# Patient Record
Sex: Female | Born: 1960
Health system: Southern US, Community
[De-identification: ages and names within clinical notes are randomized; demographics above are authoritative.]

## PROBLEM LIST (undated history)

## (undated) DIAGNOSIS — K219 Gastro-esophageal reflux disease without esophagitis: Secondary | ICD-10-CM

## (undated) DIAGNOSIS — K449 Diaphragmatic hernia without obstruction or gangrene: Secondary | ICD-10-CM

## (undated) DIAGNOSIS — F419 Anxiety disorder, unspecified: Secondary | ICD-10-CM

## (undated) HISTORY — PX: PARTIAL HYSTERECTOMY: SHX80

## (undated) HISTORY — PX: NOSE SURGERY: SHX723

## (undated) HISTORY — PX: VAGINA RECONSTRUCTION SURGERY: SHX828

## (undated) HISTORY — DX: Diaphragmatic hernia without obstruction or gangrene: K44.9

---

## 2004-10-28 ENCOUNTER — Ambulatory Visit: Payer: Self-pay | Admitting: Family Medicine

## 2004-12-22 ENCOUNTER — Other Ambulatory Visit: Admission: RE | Admit: 2004-12-22 | Discharge: 2004-12-22 | Payer: Self-pay | Admitting: Obstetrics and Gynecology

## 2005-02-09 ENCOUNTER — Ambulatory Visit: Payer: Self-pay | Admitting: Family Medicine

## 2005-04-12 ENCOUNTER — Ambulatory Visit: Payer: Self-pay | Admitting: Gastroenterology

## 2005-04-17 ENCOUNTER — Ambulatory Visit (HOSPITAL_COMMUNITY): Admission: RE | Admit: 2005-04-17 | Discharge: 2005-04-17 | Payer: Self-pay | Admitting: Gastroenterology

## 2005-04-25 ENCOUNTER — Ambulatory Visit: Payer: Self-pay | Admitting: Gastroenterology

## 2005-04-25 ENCOUNTER — Ambulatory Visit (HOSPITAL_COMMUNITY): Admission: RE | Admit: 2005-04-25 | Discharge: 2005-04-25 | Payer: Self-pay | Admitting: Gastroenterology

## 2005-04-25 DIAGNOSIS — K449 Diaphragmatic hernia without obstruction or gangrene: Secondary | ICD-10-CM | POA: Insufficient documentation

## 2005-04-25 HISTORY — PX: ESOPHAGOGASTRODUODENOSCOPY: SHX1529

## 2006-03-21 ENCOUNTER — Ambulatory Visit: Payer: Self-pay | Admitting: Family Medicine

## 2006-06-06 ENCOUNTER — Encounter (INDEPENDENT_AMBULATORY_CARE_PROVIDER_SITE_OTHER): Payer: Self-pay | Admitting: *Deleted

## 2006-06-06 ENCOUNTER — Ambulatory Visit (HOSPITAL_COMMUNITY): Admission: RE | Admit: 2006-06-06 | Discharge: 2006-06-07 | Payer: Self-pay | Admitting: Obstetrics and Gynecology

## 2007-04-22 DIAGNOSIS — F411 Generalized anxiety disorder: Secondary | ICD-10-CM | POA: Insufficient documentation

## 2007-04-22 DIAGNOSIS — F339 Major depressive disorder, recurrent, unspecified: Secondary | ICD-10-CM | POA: Insufficient documentation

## 2008-04-03 DIAGNOSIS — F341 Dysthymic disorder: Secondary | ICD-10-CM | POA: Insufficient documentation

## 2008-04-03 DIAGNOSIS — J4530 Mild persistent asthma, uncomplicated: Secondary | ICD-10-CM | POA: Insufficient documentation

## 2008-04-03 DIAGNOSIS — J45909 Unspecified asthma, uncomplicated: Secondary | ICD-10-CM | POA: Insufficient documentation

## 2008-04-06 ENCOUNTER — Ambulatory Visit: Payer: Self-pay | Admitting: Internal Medicine

## 2008-04-06 DIAGNOSIS — R1319 Other dysphagia: Secondary | ICD-10-CM | POA: Insufficient documentation

## 2008-04-06 DIAGNOSIS — K219 Gastro-esophageal reflux disease without esophagitis: Secondary | ICD-10-CM | POA: Insufficient documentation

## 2008-04-09 ENCOUNTER — Encounter: Payer: Self-pay | Admitting: Internal Medicine

## 2008-04-09 ENCOUNTER — Ambulatory Visit: Payer: Self-pay | Admitting: Internal Medicine

## 2008-04-09 HISTORY — PX: ESOPHAGOGASTRODUODENOSCOPY: SHX1529

## 2008-10-20 ENCOUNTER — Ambulatory Visit: Payer: Self-pay | Admitting: Psychiatry

## 2008-10-20 ENCOUNTER — Other Ambulatory Visit (HOSPITAL_COMMUNITY): Admission: RE | Admit: 2008-10-20 | Discharge: 2008-11-06 | Payer: Self-pay | Admitting: Psychiatry

## 2010-07-29 NOTE — Op Note (Signed)
NAMESHARNA, Sandra Deleon             ACCOUNT NO.:  [id number]   MEDICAL RECORD NO.:  [id number]          PATIENT TYPE:  AMB   LOCATION:  SDC                           FACILITY:  [facility]   PHYSICIAN:  Carrington Clamp, M.D. DATE OF BIRTH:  February 21, 1961   DATE OF PROCEDURE:  06/06/2006  DATE OF DISCHARGE:                               OPERATIVE REPORT   PREOPERATIVE DIAGNOSES:  1. Menometrorrhagia.  2. Probable endometrial polyp.  3. Right and left lower quadrant pain.   POSTOPERATIVE DIAGNOSES:  1. Menometrorrhagia.  2. Probable endometrial polyp.  3. Right and left lower quadrant pain.  4. Cystotomy.   PROCEDURES:  1. Laparoscopic-assisted vaginal hysterectomy.  2. Cystoscopy.  3. Cystotomy repair.   SURGEON:  Carrington Clamp, M.D.   ASSISTANT:  Luvenia Redden, M.D.   ANESTHESIA:  General endotracheal anesthesia.   SPECIMENS:  Uterine cervix.   ESTIMATED BLOOD LOSS:  200 mL.   IV FLUIDS:  2300 mL.   URINE OUTPUT:  Not measured.   COMPLICATIONS:  A midline cystotomy not involving the ureteral orifices  or the trigone.  The cystotomy had been performed before and after the  repair and the bilateral ureteral jets were seen well away from the  defect of the bladder.  The bladder was inspected with the cystoscope  once the defect had been repaired and found to have no other defects in  it.  The ureters were seen coursing in the pelvis before and after the  hysterectomy and the cystotomy repair.   FINDINGS:  A 7 to 8 weeks' size uterus, slightly boggy.  The ovaries  were normal in appearance bilaterally and the patient wished to retain  them if they were normal in appearance, so they were left.  There was a  small hydatidiform cyst on the right-hand side that had no adhesions to  it, so it was left.  The Falope rings were removed from the abdomen.  The appendix was not able to be seen but there was a small adhesion of  the cecum to the anterior abdominal wall.  Since this  was out of the  range of where the pain was felt by the patient, it was left alone.  The  liver edge and gallbladder appeared normal.  As noted above, the  ureteral orifices were inspected before and after the cystotomy repair  and the hysterectomy.  The bladder was found to be intact after the  cystotomy repair, and the ureteral orifices spilling bilateral indigo  carmine without problem.   MEDICATIONS:  Lidocaine with epinephrine.  Enfamil milk in the bladder  and indigo carmine.   COUNTS:  The counts were correct times three.   TECHNIQUE:  After adequate general anesthesia was achieved, the patient  was prepped and draped in the usual sterile fashion in the dorsal  lithotomy position.  A Kahn cannula was placed into the cervix after  grasping it with a single-tooth tenaculum.  The bladder was emptied with  a catheter during the procedure.  Attention was turned to the abdomen  where a 2 cm infraumbilical incision was made with the scalpel.  The  Veress needle was introduced into the abdomen without complication and  without aspiration of bowel contents or blood.  The abdomen was  insufflated and the 10 mm trocar placed without complication.   Two 5 mm ports were placed under direct visualization of the camera  outside of the area of the inferior epigastrics.  An inspection of the  pelvis was performed and the above findings noted.  The right tube was  then grasped with the gyrus instrument and cautery was achieved proximal  to the patient from where the Falope rings were.  Cautery continued in  the uterine ovarian ligament and the round ligament to just above the  level of the bladder reflection.  The same procedure was performed on  the other side and good hemostasis was achieved.   Attention was then turned to the vagina where the bladder was filled  with 60 mL of Enfamil.  The catheter was removed and the cervix grasped  with the Lahey clamp.  The cervix was injected with  lidocaine with epi  in a circumferential manner.  A scalpel was used to incise the cervix in  a circumferential manner.  Posteriorly Mayo's were used to enter into  the cul-de-sac with a long duckbill placed.  Dissection began anteriorly  with the Metzenbaum scissors which looked like the appropriate plane at  the time.  The peritoneum was unable to be breached anteriorly so four  alternating successive bites of the Heaney clamp were used to secure  both the uterosacral's and the first part of the cardinal ligament.  The  uterosacral's were Heaney stitched and the first part of the cardinal  ligament was secured with a regular stitch.  Each pedicle was then  incised and the uterosacral ligaments stitches retained.   At this point in time a large quantity of milk appeared on the field and  it was evident that there had been a breach of the bladder.  Manual  palpation after replacing the Foley and filling the bulb revealed a  small 1-1/2 cm defect midline.  Cystoscopy was performed at this point  and after the sterile milk was able to be washed from the bladder, a  cystotomy could be seen in the posterior bladder but superior to the  trigone and definitely not involving the ureteral orifices, both of  which were spilling clear urine.  The cystoscope was removed and the  Foley placed with the bulb again.  Dissection was re-initiated on the  cervix deeper towards the cervix this time to ensure being in the  vesicouterine fascia and posterior to the bladder.  By manually feeling  for the Foley bulb, we were able to identify the correct plane and  finally enter into the anterior peritoneum.  The rest of the cardinal  ligament and the lower portion of broad ligament were then secured with  alternating successive bites of the Heaney clamp.  Each pedicle was  incised with the Mayo scissors and then secured with a stitch of 0  Vicryl.  Once the uterus was amputated and handed off to Pathology,  the anterior  peritoneum edge was grasped as well as the anterior vaginal edge was  grasped and the Foley bulb aided in the palpation of the cystotomy.  The  edges of the bladder were clearly carefully identified and the bladder  defect was then closed with a running stitch of 3-0 Vicryl.  This was  done in multiple stitches to ensure water tightness.  A  second layer  closed the vesicouterine fascia over the defect with 2-0 Vicryl.  The  cystoscope was passed back into the bladder and indigo carmine was  injected and could eventually be seen exiting from both ureteral  orifices which were away from the defect.  The defect appeared closed  and intact, and the bladder did fill.  There were no other defects in  the bladder noted at this point.  The anterior peritoneum was then  grasped with a Tresa Endo and secured with 2-0 Vicryl Swedgeon stitch.  This  stitch then incorporated the left uterosacral, went through the  posterior vagina between the uterosacral's and back into the peritoneal  cavity, came across in a modified Moschcowitz stitch and came out the  vagina and back in again between the uterosacral's and the posterior cul-  de-sac area.  The right uterosacral was then secured as well as the  anterior peritoneum and the whole pursestring stitch was cinched down to  close the peritoneum.  The vaginal cuff was then closed with anterior to  posterior figure-of-eight stitches of 0 Vicryl.  Hemostasis was achieved  here.  Gloves were changed and attention was turned to the abdomen  again.   The abdomen was reinsufflated and the pedicles inspected and found to be  hemostatic.  There appeared to be a large amount of probably 100 mL of  water in the anterior peritoneum that was probably filled in during the  time that we were doing the cystoscopy.  The bladder had been intact.  There was every indication that the bladder was intact to the cystoscopy  that had been performed a few minutes earlier  that we believed that this  was left over fluid from the cystoscopy.  The ureters were reinspected  and found to be coursing out of the field of dissection and coursing  well.  All instruments were then withdrawn from the abdomen.  The 10 mm  trocar fascial site was closed with a figure-of-eight stitch of 2-0  Vicryl.  The 5 mm trocar sites or skin sites were closed with a through-  and-through stitch of 3-0 Vicryl Rapide.  The skin incisions were closed  with Dermabond.  The patient's bladder was emptied with a new Foley and  all instruments were withdrawn from the vagina.  The complication will  be discussed with the patient tomorrow when she is awake.  I will be  discussing the complication with her husband when I go to talk to him  just shortly.      Carrington Clamp, M.D.  Electronically Signed     MH/MEDQ  D:  06/06/2006  T:  06/06/2006  Job:  [id number]

## 2011-05-03 ENCOUNTER — Emergency Department (HOSPITAL_COMMUNITY)
Admission: EM | Admit: 2011-05-03 | Discharge: 2011-05-03 | Disposition: A | Discharge status: Home / Self Care | Payer: BC Managed Care – PPO | Attending: Emergency Medicine | Admitting: Emergency Medicine

## 2011-05-03 ENCOUNTER — Encounter (HOSPITAL_COMMUNITY): Payer: Self-pay | Admitting: *Deleted

## 2011-05-03 ENCOUNTER — Emergency Department (HOSPITAL_COMMUNITY): Payer: BC Managed Care – PPO

## 2011-05-03 DIAGNOSIS — J40 Bronchitis, not specified as acute or chronic: Secondary | ICD-10-CM

## 2011-05-03 DIAGNOSIS — J45909 Unspecified asthma, uncomplicated: Secondary | ICD-10-CM | POA: Insufficient documentation

## 2011-05-03 DIAGNOSIS — F411 Generalized anxiety disorder: Secondary | ICD-10-CM | POA: Insufficient documentation

## 2011-05-03 HISTORY — DX: Anxiety disorder, unspecified: F41.9

## 2011-05-03 HISTORY — DX: Gastro-esophageal reflux disease without esophagitis: K21.9

## 2011-05-03 MED ORDER — ALBUTEROL SULFATE HFA 108 (90 BASE) MCG/ACT IN AERS: 1.0000 | INHALATION_SPRAY | Freq: Four times a day (QID) | RESPIRATORY_TRACT | Status: DC | PRN

## 2011-05-03 MED ORDER — PREDNISONE 20 MG PO TABS: 60.0000 mg | ORAL_TABLET | Freq: Every day | ORAL | Status: AC

## 2011-05-03 MED ORDER — ALBUTEROL SULFATE (5 MG/ML) 0.5% IN NEBU
5.0000 mg | INHALATION_SOLUTION | Freq: Once | RESPIRATORY_TRACT | Status: AC
  Administered 2011-05-03: 5 mg via RESPIRATORY_TRACT
  Filled 2011-05-03: qty 1

## 2011-05-03 MED ORDER — AZITHROMYCIN 250 MG PO TABS: ORAL_TABLET | ORAL | Status: AC

## 2011-05-03 MED ORDER — SALINE SPRAY 0.65 % NA SOLN
NASAL | Status: AC
  Filled 2011-05-03: qty 44

## 2011-05-03 MED ORDER — PREDNISONE 20 MG PO TABS
60.0000 mg | ORAL_TABLET | Freq: Once | ORAL | Status: AC
  Administered 2011-05-03: 60 mg via ORAL
  Filled 2011-05-03: qty 3

## 2011-05-03 MED ORDER — SALINE SPRAY 0.65 % NA SOLN
1.0000 | NASAL | Status: DC | PRN
  Administered 2011-05-03: 1 via NASAL
  Filled 2011-05-03: qty 44

## 2011-05-03 NOTE — ED Notes (Addendum)
Reports cough x 4 days; reports feeling like throat is swelling x 1 day, worse tonight; c/o difficulty swallowing and feeling short of breath, increasingly worse this morning. Rec'd Albuterol neb tx x 1 en route and states this helped relieve her symptoms.

## 2011-05-03 NOTE — ED Notes (Signed)
Called [facility] for medications  

## 2011-05-03 NOTE — ED Provider Notes (Signed)
History     CSN: [id number]  Arrival date & time 05/03/11  0312   First MD Initiated Contact with Patient 05/03/11 [phone]      Chief Complaint  Patient presents with  . Dysphagia  . Shortness of Breath    (Consider location/radiation/quality/duration/timing/severity/associated sxs/prior treatment) Patient is a 51 y.o. female presenting with shortness of breath. The history is provided by the patient.  Shortness of Breath  Episode onset: about 4 weeks ago. Not getting better. The onset was gradual. The problem occurs frequently. The problem has been unchanged. The problem is moderate. The symptoms are relieved by beta-agonist inhalers and rest. The symptoms are aggravated by nothing. Associated symptoms include cough and wheezing. Pertinent negatives include no chest pain, no chest pressure, no fever and no rhinorrhea. Her past medical history is significant for asthma. There were no sick contacts.   Nonsmoker with history of asthma. States she gets bronchitis every year and this feels like her bronchitis again. No fevers. She does have associated congestion with continuous dry cough. Tonight is keeping her awake and she presents here for evaluation. Her throat feels irritated when she coughs but denies any pain or radiation. No chest discomfort. No leg pain or swelling. No abdominal pain. No nausea vomiting or diarrhea. Her friend gave her antibiotics about 2 weeks ago that did not help now she feels like her symptoms are getting worse. moderate in severity.albuterol seemed to help in route. She states she does not take her albuterol like she should. She has never required admission or intubation for asthma.  Past Medical History  Diagnosis Date  . Asthma   . Anxiety   . GERD (gastroesophageal reflux disease)   . Hernia     Past Surgical History  Procedure Date  . Nose surgery     blockage removed from right side of nose  . Esophageal dilation     No family history on  file.  History  Substance Use Topics  . Smoking status: Not on file  . Smokeless tobacco: Not on file  . Alcohol Use:     OB History    Grav Para Term Preterm Abortions TAB SAB Ect Mult Living                  Review of Systems  Constitutional: Negative for fever and chills.  HENT: Positive for congestion. Negative for rhinorrhea, trouble swallowing, neck pain, neck stiffness and voice change.   Eyes: Negative for pain.  Respiratory: Positive for cough and wheezing.   Cardiovascular: Negative for chest pain and leg swelling.  Gastrointestinal: Negative for abdominal pain.  Genitourinary: Negative for dysuria.  Musculoskeletal: Negative for back pain.  Skin: Negative for rash.  Neurological: Negative for headaches.  All other systems reviewed and are negative.    Allergies  Codeine and Pseudoephedrine  Home Medications   Current Outpatient Rx  Name Route Sig Dispense Refill  . CLONAZEPAM 1 MG PO TABS Oral Take 1 mg by mouth at bedtime as needed.    Marland Kitchen LAMOTRIGINE 25 MG PO TABS Oral Take 50 mg by mouth daily.    Marland Kitchen MONTELUKAST SODIUM 10 MG PO TABS Oral Take 10 mg by mouth at bedtime.    Marland Kitchen ONE-DAILY MULTI VITAMINS PO TABS Oral Take 1 tablet by mouth daily.      BP 101/67  Pulse 60  Temp(Src) 98.6 F (37 C) (Oral)  Resp 20  Ht 5\' 4"  (1.626 m)  Wt 130 lb (58.968 kg)  BMI 22.31 kg/m2  SpO2 100%  Physical Exam  Constitutional: She is oriented to person, place, and time. She appears well-developed and well-nourished.  HENT:  Head: Normocephalic and atraumatic.  Eyes: Conjunctivae and EOM are normal. Pupils are equal, round, and reactive to light.  Neck: Trachea normal. Neck supple. No thyromegaly present.  Cardiovascular: Normal rate, regular rhythm, S1 normal, S2 normal and normal pulses.     No systolic murmur is present   No diastolic murmur is present  Pulses:      Radial pulses are 2+ on the right side, and 2+ on the left side.  Pulmonary/Chest: Effort  normal. No respiratory distress. She has no wheezes. She has no rhonchi. She has no rales. She exhibits no tenderness.       Mildly decreased breath sounds bilaterally. Dry cough during exam  Abdominal: Soft. Normal appearance and bowel sounds are normal. There is no tenderness. There is no CVA tenderness and negative Murphy's sign.  Musculoskeletal:       BLE:s Calves nontender, no cords or erythema, negative Homans sign  Neurological: She is alert and oriented to person, place, and time. She has normal strength. No cranial nerve deficit or sensory deficit. GCS eye subscore is 4. GCS verbal subscore is 5. GCS motor subscore is 6.  Skin: Skin is warm and dry. No rash noted. She is not diaphoretic.  Psychiatric: Her speech is normal.       Cooperative and appropriate    ED Course  Procedures (including critical care time)  Improved with albuterol breathing treatment and prednisone. Chest x-ray reviewed no pneumothorax or infiltrates.  MDM   Clinical bronchitis, Complicated by history of asthma. Given albuterol and steroids. Z-Pak for worsening symptoms and although likely started as a viral infection may now be complicated by bacterial infection as well.        Sunnie Nielsen, MD 05/03/11 [phone]

## 2011-05-03 NOTE — Discharge Instructions (Signed)
Bronchitis     Bronchitis is a problem of the air tubes leading to your lungs. This problem makes it hard for air to get in and out of the lungs. You may cough a lot because your air tubes are narrow. Going without care can cause lasting (chronic) bronchitis.  HOME CARE   · Drink enough fluids to keep your pee (urine) clear or pale yellow.   · Use a cool mist humidifier.   · Quit smoking if you smoke. If you keep smoking, the bronchitis might not get better.   · Only take medicine as told by your doctor.   GET HELP RIGHT AWAY IF:   · Coughing keeps you awake.   · You start to wheeze.   · You become more sick or weak.   · You have a hard time breathing or get short of breath.   · You cough up blood.   · Coughing lasts more than 2 weeks.   · You have a fever.   · Your baby is older than 3 months with a rectal temperature of 102° F (38.9° C) or higher.   · Your baby is 3 months old or younger with a rectal temperature of 100.4° F (38° C) or higher.   MAKE SURE YOU:  · Understand these instructions.   · Will watch your condition.   · Will get help right away if you are not doing well or get worse.   Document Released: 08/16/2007 Document Revised: 11/09/2010 Document Reviewed: 01/29/2009  [employer]® Patient Information ©2012 [employer].

## 2011-05-09 ENCOUNTER — Encounter: Payer: Self-pay | Admitting: Gastroenterology

## 2011-05-16 ENCOUNTER — Other Ambulatory Visit: Payer: Self-pay | Admitting: Obstetrics and Gynecology

## 2011-05-26 ENCOUNTER — Ambulatory Visit: Payer: Self-pay | Admitting: Gastroenterology

## 2011-06-19 ENCOUNTER — Ambulatory Visit: Payer: Self-pay | Admitting: Internal Medicine

## 2011-10-04 DIAGNOSIS — H81399 Other peripheral vertigo, unspecified ear: Secondary | ICD-10-CM | POA: Insufficient documentation

## 2013-01-20 ENCOUNTER — Telehealth: Payer: Self-pay | Admitting: Internal Medicine

## 2013-01-20 NOTE — Telephone Encounter (Signed)
Per pt she would like to switch care from Dr. Leone Payor to Dr. Marina Goodell. She says all her family members including her husband see Dr. Marina Goodell.

## 2013-01-21 ENCOUNTER — Encounter: Payer: Self-pay | Admitting: Internal Medicine

## 2013-01-21 NOTE — Telephone Encounter (Signed)
Left a message for the pt to c/b and schedule.  °

## 2013-01-21 NOTE — Telephone Encounter (Signed)
Sure

## 2013-01-21 NOTE — Telephone Encounter (Signed)
Dr. Perry will you accept? 

## 2013-01-21 NOTE — Telephone Encounter (Signed)
Pt is scheduled for her colon in Jan 2015 with Dr. Marina Goodell.

## 2013-02-04 ENCOUNTER — Encounter: Payer: Self-pay | Admitting: Internal Medicine

## 2013-02-20 ENCOUNTER — Encounter: Payer: Self-pay | Admitting: Internal Medicine

## 2013-02-25 ENCOUNTER — Ambulatory Visit (AMBULATORY_SURGERY_CENTER): Payer: 59

## 2013-02-25 VITALS — Ht 64.0 in

## 2013-02-25 DIAGNOSIS — Z1211 Encounter for screening for malignant neoplasm of colon: Secondary | ICD-10-CM

## 2013-02-25 NOTE — Progress Notes (Signed)
Pt came into the office today for her pre-visit prior to her colonosocpy with Dr Marina Goodell on 03/12/13. Pt is requesting an endoscopy at the same time as the colonoscopy due to having abdominal pain, bloating and burning. Pt was scheduled for an office visit with Dr Marina Goodell on 04/01/13 to discuss her symptoms. The colonoscopy  appt on 03/12/13 was cancelled. Pt understood. She will call our office if she has any other questions.

## 2013-03-12 ENCOUNTER — Encounter: Payer: BC Managed Care – PPO | Admitting: Internal Medicine

## 2013-03-27 ENCOUNTER — Encounter: Payer: BC Managed Care – PPO | Admitting: Internal Medicine

## 2013-04-01 ENCOUNTER — Encounter: Payer: Self-pay | Admitting: Internal Medicine

## 2013-04-01 ENCOUNTER — Ambulatory Visit (INDEPENDENT_AMBULATORY_CARE_PROVIDER_SITE_OTHER): Payer: 59 | Admitting: Internal Medicine

## 2013-04-01 VITALS — BP 100/70 | HR 68 | Ht 64.0 in | Wt 136.0 lb

## 2013-04-01 DIAGNOSIS — K219 Gastro-esophageal reflux disease without esophagitis: Secondary | ICD-10-CM

## 2013-04-01 DIAGNOSIS — R1310 Dysphagia, unspecified: Secondary | ICD-10-CM

## 2013-04-01 DIAGNOSIS — Z1211 Encounter for screening for malignant neoplasm of colon: Secondary | ICD-10-CM

## 2013-04-01 MED ORDER — MOVIPREP 100 G PO SOLR: 1.0000 | Freq: Once | ORAL | Status: DC

## 2013-04-01 MED ORDER — DEXLANSOPRAZOLE 60 MG PO CPDR: 60.0000 mg | DELAYED_RELEASE_CAPSULE | Freq: Every day | ORAL | Status: DC

## 2013-04-01 NOTE — Patient Instructions (Signed)
You have been scheduled for an endoscopy and colonoscopy with propofol. Please follow the written instructions given to you at your visit today. Please pick up your prep at the pharmacy within the next 1-3 days. If you use inhalers (even only as needed), please bring them with you on the day of your procedure. Your physician has requested that you go to www.startemmi.com and enter the access code given to you at your visit today. This web site gives a general overview about your procedure. However, you should still follow specific instructions given to you by our office regarding your preparation for the procedure.   You have been given some samples of Dexilant.  Take 1 daily 30 minutes before breakfast until you see Dr. Marina GoodellPerry again

## 2013-04-01 NOTE — Progress Notes (Signed)
HISTORY OF PRESENT ILLNESS:  Sandra Deleon is a 53 y.o. female with the below listed medical history who presents today at regarding possible GERD, dysphagia, and routine colon cancer screening. Previous patient of Dr. Victorino DikeSam Lamar and more recently Dr. Stan Headarl Gessner. Switching to me because I take care of her husband. In any event, the patient underwent evaluation for possible GERD and atypical sounding dysphagia in January of 2010. Upper endoscopy at that time was normal except for small hiatal hernia. Biopsies of the esophagus were normal. Empiric esophageal dilation performed. The patient cannot recall whether this was helpful. She has been on acid suppressing medications sporadically. She cannot specify. She is not certain if they help. Currently takes occasional Zantac. If she does have problems, seems to be some discomfort related to solids. No liquid issues. Remote esophagram negative. Other GI complaints include increased intestinal gas and constipation. No bleeding. No family history of colon cancer. No prior colonoscopy. No significant active medical problems.  REVIEW OF SYSTEMS:  All non-GI ROS negative except for anxiety, allergies  Past Medical History  Diagnosis Date  . Asthma   . Anxiety   . GERD (gastroesophageal reflux disease)   . Hiatal hernia     Past Surgical History  Procedure Laterality Date  . Nose surgery      blockage removed from right side of nose  . Esophagogastroduodenoscopy  04/09/2008    with Elease HashimotoMaloney dilation  . Esophagogastroduodenoscopy  04/25/2005  . Partial hysterectomy      still has ovaries  . Vagina reconstruction surgery      past childbirth    Social History Sandra FermoCynthia R Jezewski  reports that she has never smoked. She has never used smokeless tobacco. She reports that she does not drink alcohol or use illicit drugs.  family history is not on file.  Allergies  Allergen Reactions  . Codeine     rash  . Pseudoephedrine     Heart beating  through body.       PHYSICAL EXAMINATION: Vital signs: BP 100/70  Pulse 68  Ht 5\' 4"  (1.626 m)  Wt 136 lb (61.689 kg)  BMI 23.33 kg/m2  Constitutional: generally well-appearing, no acute distress Psychiatric: alert and oriented x3, cooperative Eyes: extraocular movements intact, anicteric, conjunctiva pink Mouth: oral pharynx moist, no lesions Neck: supple no lymphadenopathy Cardiovascular: heart regular rate and rhythm, no murmur Lungs: clear to auscultation bilaterally Abdomen: soft, nontender, nondistended, no obvious ascites, no peritoneal signs, normal bowel sounds, no organomegaly Rectal: Deferred until colonoscopy Extremities: no lower extremity edema bilaterally Skin: no lesions on visible extremities Neuro: No focal deficits.   ASSESSMENT:  #1. Possible GERD. Difficult to tell by history #2. Atypical dysphagia. Prior endoscopy as described. #3. Colon cancer screening. Baseline risk   PLAN:  #1. Dexilant 60 mg daily. Multiple samples provided (3 weeks) #2. Reflux precautions #3. Upper endoscopy. Possible dilation if significant stricture found.The nature of the procedure, as well as the risks, benefits, and alternatives were carefully and thoroughly reviewed with the patient. Ample time for discussion and questions allowed. The patient understood, was satisfied, and agreed to proceed. #4. Screening colonoscopy. Movi prep prescribed. Patient instructed on its use. The nature of the procedure, as well as the risks, benefits, and alternatives were carefully and thoroughly reviewed with the patient. Ample time for discussion and questions allowed. The patient understood, was satisfied, and agreed to proceed.

## 2013-04-02 ENCOUNTER — Encounter: Payer: Self-pay | Admitting: Internal Medicine

## 2013-04-16 ENCOUNTER — Encounter: Payer: Self-pay | Admitting: Internal Medicine

## 2013-04-16 ENCOUNTER — Ambulatory Visit (AMBULATORY_SURGERY_CENTER): Payer: 59 | Admitting: Internal Medicine

## 2013-04-16 VITALS — BP 99/59 | HR 50 | Temp 98.7°F | Resp 17 | Ht 64.0 in | Wt 136.0 lb

## 2013-04-16 DIAGNOSIS — R1319 Other dysphagia: Secondary | ICD-10-CM

## 2013-04-16 DIAGNOSIS — R1310 Dysphagia, unspecified: Secondary | ICD-10-CM

## 2013-04-16 DIAGNOSIS — K219 Gastro-esophageal reflux disease without esophagitis: Secondary | ICD-10-CM

## 2013-04-16 DIAGNOSIS — Z1211 Encounter for screening for malignant neoplasm of colon: Secondary | ICD-10-CM

## 2013-04-16 DIAGNOSIS — K222 Esophageal obstruction: Secondary | ICD-10-CM

## 2013-04-16 MED ORDER — SODIUM CHLORIDE 0.9 % IV SOLN: 500.0000 mL | INTRAVENOUS | Status: DC

## 2013-04-16 NOTE — Patient Instructions (Signed)
Clear liquids diet until 6 pm, then soft foods rest of day. Resume prior diet tomorrow. (see dilation diet handout) Repeat colonoscopy in 10 years. Resume current medications today. Call us with any questions or concerns. Thank you!  YOU HAD AN ENDOSCOPIC PROCEDURE TODAY AT THE [facility]: Refer to the procedure report that was given to you for any specific questions about what was found during the examination.  If the procedure report does not answer your questions, please call your gastroenterologist to clarify.  If you requested that your care partner not be given the details of your procedure findings, then the procedure report has been included in a sealed envelope for you to review at your convenience later.  YOU SHOULD EXPECT: Some feelings of bloating in the abdomen. Passage of more gas than usual.  Walking can help get rid of the air that was put into your GI tract during the procedure and reduce the bloating. If you had a lower endoscopy (such as a colonoscopy or flexible sigmoidoscopy) you may notice spotting of blood in your stool or on the toilet paper. If you underwent a bowel prep for your procedure, then you may not have a normal bowel movement for a few days.  DIET: Dilation diet, see handout.  Drink plenty of fluids but you should avoid alcoholic beverages for 24 hours.  ACTIVITY: Your care partner should take you home directly after the procedure.  You should plan to take it easy, moving slowly for the rest of the day.  You can resume normal activity the day after the procedure however you should NOT DRIVE or use heavy machinery for 24 hours (because of the sedation medicines used during the test).    SYMPTOMS TO REPORT IMMEDIATELY: A gastroenterologist can be reached at any hour.  During normal business hours, 8:30 AM to 5:00 PM Monday through Friday, call [phone].  After hours and on weekends, please call the GI answering service at [phone] who will take a  message and have the physician on call contact you.   Following lower endoscopy (colonoscopy or flexible sigmoidoscopy):  Excessive amounts of blood in the stool  Significant tenderness or worsening of abdominal pains  Swelling of the abdomen that is new, acute  Fever of 100F or higher  Following upper endoscopy (EGD)  Vomiting of blood or coffee ground material  New chest pain or pain under the shoulder blades  Painful or persistently difficult swallowing  New shortness of breath  Fever of 100F or higher  Black, tarry-looking stools  FOLLOW UP: If any biopsies were taken you will be contacted by phone or by letter within the next 1-3 weeks.  Call your gastroenterologist if you have not heard about the biopsies in 3 weeks.  Our staff will call the home number listed on your records the next business day following your procedure to check on you and address any questions or concerns that you may have at that time regarding the information given to you following your procedure. This is a courtesy call and so if there is no answer at the home number and we have not heard from you through the emergency physician on call, we will assume that you have returned to your regular daily activities without incident.  SIGNATURES/CONFIDENTIALITY: You and/or your care partner have signed paperwork which will be entered into your electronic medical record.  These signatures attest to the fact that that the information above on your After Visit Summary has been  reviewed and is understood.  Full responsibility of the confidentiality of this discharge information lies with you and/or your care-partner. 

## 2013-04-16 NOTE — Op Note (Signed)
[facility] [address] [place], [id number]   ENDOSCOPY PROCEDURE REPORT  PATIENT: Sandra Deleon, Sandra R.  MR#: [id number] BIRTHDATE: 1961/02/07 , 52  yrs. old GENDER: Female ENDOSCOPIST: Roxy CedarJohn N Calyn Sivils Jr, MD REFERRED BY:  Herb Graysammy Spear, M.D. PROCEDURE DATE:  04/16/2013 PROCEDURE:  EGD, diagnostic and Maloney dilation of esophagus  -7254 French ASA CLASS:     Class II INDICATIONS:  Dysphagia. MEDICATIONS: MAC sedation, administered by CRNA and propofol (Diprivan) 100mg  IV TOPICAL ANESTHETIC: Cetacaine Spray  DESCRIPTION OF PROCEDURE: After the risks benefits and alternatives of the procedure were thoroughly explained, informed consent was obtained.  The LB [id number] endoscope was introduced through the mouth and advanced to the second portion of the duodenum. Without limitations.  The instrument was slowly withdrawn as the mucosa was fully examined.    EXAM:The esophagus revealed a large caliber distal ring but was otherwise normal.  The stomach was normal.  The duodenum was normal.  Retroflexed views revealed a hiatal hernia.     The scope was then withdrawn from the patient and the procedure completed.  Therapy: 2354 French Maloney dilator was passed into the esophagus without resistance or heme. Tolerated well  COMPLICATIONS: There were no complications.  ENDOSCOPIC IMPRESSION: 1. Low esophageal ring status post dilation. 2. Otherwise normal EGD  RECOMMENDATIONS: 1. Clear liquids until  6 PM, then soft foods rest of day.  Resume prior diet tomorrow.  REPEAT EXAM:  eSigned:  Roxy CedarJohn N Linell Shawn Jr, MD 04/16/2013 4:02 PM   MW:UXLKGCC:Tammy Collins ScotlandSpear, MD and The Patient

## 2013-04-16 NOTE — Op Note (Signed)
[facility] [address] [place], [id number]   COLONOSCOPY PROCEDURE REPORT  PATIENT: Sandra Deleon, [name]  MR#: [id number] BIRTHDATE: [birth year] , 52  yrs. old GENDER: Female ENDOSCOPIST: Roxy CedarJohn N Norita Meigs Jr, MD REFERRED XB:JYNWGBY:Tammy Spear, M.D. PROCEDURE DATE:  04/16/2013 PROCEDURE:   Colonoscopy, screening First Screening Colonoscopy - Avg.  risk and is 50 yrs.  old or older Yes.  Prior Negative Screening - Now for repeat screening. N/A  History of Adenoma - Now for follow-up colonoscopy & has been > or = to 3 yrs.  N/A  Polyps Removed Today? No.  Recommend repeat exam, <10 yrs? No. ASA CLASS:   Class II INDICATIONS:average risk screening. MEDICATIONS: MAC sedation, administered by CRNA and propofol (Diprivan) 330mg  IV  DESCRIPTION OF PROCEDURE:   After the risks benefits and alternatives of the procedure were thoroughly explained, informed consent was obtained.  A digital rectal exam revealed no abnormalities of the rectum.   The LB [id number]  endoscope was introduced through the anus and advanced to the cecum, which was identified by both the appendix and ileocecal valve. No adverse events experienced.   The quality of the prep was good, using MoviPrep  The instrument was then slowly withdrawn as the colon was fully examined.      COLON FINDINGS: A normal appearing cecum, ileocecal valve, and appendiceal orifice were identified.  The ascending, hepatic flexure, transverse, splenic flexure, descending, sigmoid colon and rectum appeared unremarkable.  No polyps or cancers were seen. Retroflexed views revealed no abnormalities. The time to cecum=3 minutes 51 seconds.  Withdrawal time=10 minutes 57 seconds.  The scope was withdrawn and the procedure completed.  COMPLICATIONS: There were no complications.  ENDOSCOPIC IMPRESSION: 1. Normal colon  RECOMMENDATIONS: 1.  Continue current colorectal screening recommendations for "routine risk" patients  with a repeat colonoscopy in 10 years. 2.  Upper endoscopy today (see report)   eSigned:  Roxy CedarJohn N Rosena Bartle Jr, MD 04/16/2013 3:54 PM   cc: Herb Graysammy Spear, MD and The Patient

## 2013-04-16 NOTE — Progress Notes (Signed)
Report to pacu rn, vss, bbs=clear 

## 2013-04-16 NOTE — Progress Notes (Signed)
Called to room to assist during endoscopic procedure.  Patient ID and intended procedure confirmed with present staff. Received instructions for my participation in the procedure from the performing physician. ewm 

## 2013-04-17 ENCOUNTER — Telehealth: Payer: Self-pay | Admitting: *Deleted

## 2013-04-17 NOTE — Telephone Encounter (Signed)
  Follow up Call-  Call back number 04/16/2013  Post procedure Call Back phone  # [phone]  Permission to leave phone message Yes     Patient questions:  Do you have a fever, pain , or abdominal swelling? no Pain Score  0 *  Have you tolerated food without any problems? yes  Have you been able to return to your normal activities? yes  Do you have any questions about your discharge instructions: Diet   no Medications  no Follow up visit  no  Do you have questions or concerns about your Care? no  Actions: * If pain score is 4 or above: No action needed, pain <4.

## 2013-09-22 ENCOUNTER — Encounter: Payer: Self-pay | Admitting: Family

## 2013-09-22 ENCOUNTER — Ambulatory Visit (INDEPENDENT_AMBULATORY_CARE_PROVIDER_SITE_OTHER): Payer: 59 | Admitting: Family

## 2013-09-22 ENCOUNTER — Encounter (INDEPENDENT_AMBULATORY_CARE_PROVIDER_SITE_OTHER): Payer: Self-pay

## 2013-09-22 VITALS — BP 99/63 | HR 58 | Temp 98.2°F | Ht 64.25 in | Wt 136.6 lb

## 2013-09-22 DIAGNOSIS — J45909 Unspecified asthma, uncomplicated: Secondary | ICD-10-CM

## 2013-09-22 DIAGNOSIS — F341 Dysthymic disorder: Secondary | ICD-10-CM

## 2013-09-22 MED ORDER — PAROXETINE HCL 40 MG PO TABS: 40.0000 mg | ORAL_TABLET | ORAL | Status: DC

## 2013-09-22 MED ORDER — CLONAZEPAM 1 MG PO TABS: 1.0000 mg | ORAL_TABLET | Freq: Two times a day (BID) | ORAL | Status: DC

## 2013-09-22 MED ORDER — ALBUTEROL SULFATE HFA 108 (90 BASE) MCG/ACT IN AERS: 1.0000 | INHALATION_SPRAY | Freq: Four times a day (QID) | RESPIRATORY_TRACT | Status: DC | PRN

## 2013-09-22 MED ORDER — MONTELUKAST SODIUM 10 MG PO TABS
10.0000 mg | ORAL_TABLET | Freq: Every day | ORAL | Status: DC
Start: 2013-09-22 — End: 2014-10-02

## 2013-09-22 NOTE — Patient Instructions (Signed)

## 2013-09-22 NOTE — Progress Notes (Signed)
   Subjective:    Patient ID: Sandra Deleon, female    DOB: Nov 26, 1960, 53 y.o.   MRN: [id number]  Pt to establish care. Pt has anxiety, depression, and asthma.  Anxiety Presents for follow-up visit. Symptoms include depressed mood and nervous/anxious behavior. Patient reports no excessive worry, insomnia, irritability, palpitations, panic, restlessness or shortness of breath. Symptoms occur occasionally. The quality of sleep is good.   Her past medical history is significant for anxiety/panic attacks and depression. Past treatments include SSRIs and benzodiazephines. The treatment provided moderate relief. Compliance with prior treatments has been good.  Asthma Pt states she only uses her inhaler as needed. Less than once every couple of months. Pt takes her singular daily. Pt states she feels as if her asthma is well controlled.     Review of Systems  Constitutional: Negative.  Negative for irritability.  HENT: Negative.   Eyes: Negative.   Respiratory: Negative.  Negative for shortness of breath.   Cardiovascular: Negative.  Negative for palpitations.  Gastrointestinal: Negative.   Endocrine: Negative.   Genitourinary: Negative.   Musculoskeletal: Negative.   Neurological: Negative.  Negative for headaches.  Hematological: Negative.   Psychiatric/Behavioral: The patient is nervous/anxious. The patient does not have insomnia.   All other systems reviewed and are negative.      Objective:   Physical Exam  Vitals reviewed. Constitutional: She is oriented to person, place, and time. She appears well-developed and well-nourished. No distress.  HENT:  Head: Normocephalic and atraumatic.  Right Ear: External ear normal.  Mouth/Throat: Oropharynx is clear and moist.  Eyes: Pupils are equal, round, and reactive to light.  Neck: Normal range of motion. Neck supple. No thyromegaly present.  Cardiovascular: Normal rate, regular rhythm, normal heart sounds and intact distal pulses.    No murmur heard. Pulmonary/Chest: Effort normal and breath sounds normal. No respiratory distress. She has no wheezes.  Abdominal: Soft. Bowel sounds are normal. She exhibits no distension. There is no tenderness.  Musculoskeletal: Normal range of motion. She exhibits no edema and no tenderness.  Neurological: She is alert and oriented to person, place, and time. She has normal reflexes. No cranial nerve deficit.  Skin: Skin is warm and dry.  Psychiatric: She has a normal mood and affect. Her behavior is normal. Judgment and thought content normal.      BP 99/63  Pulse 58  Temp(Src) 98.2 F (36.8 C) (Oral)  Ht 5' 4.25" (1.632 m)  Wt 136 lb 9.6 oz (61.961 kg)  BMI 23.26 kg/m2     Assessment & Plan:  1. ASTHMA - albuterol (PROVENTIL HFA;VENTOLIN HFA) 108 (90 BASE) MCG/ACT inhaler; Inhale 1-2 puffs into the lungs every 6 (six) hours as needed for wheezing.  Dispense: 1 Inhaler; Refill: 6 - montelukast (SINGULAIR) 10 MG tablet; Take 1 tablet (10 mg total) by mouth at bedtime.  Dispense: 90 tablet; Refill: 3  2. DEPRESSION/ANXIETY - CMP14+EGFR - PARoxetine (PAXIL) 40 MG tablet; Take 1 tablet (40 mg total) by mouth every morning.  Dispense: 90 tablet; Refill: 3 - clonazePAM (KLONOPIN) 1 MG tablet; Take 1 tablet (1 mg total) by mouth 2 (two) times daily.  Dispense: 30 tablet; Refill: 1   Continue all meds Labs pending Health Maintenance reviewed-Pt to be scheduled for mammogram Diet and exercise encouraged RTO 1 year  Evelina Dun, FNP

## 2013-09-23 LAB — CMP14+EGFR
A/G RATIO: 2.5 (ref 1.1–2.5)
ALK PHOS: 62 IU/L (ref 39–117)
ALT: 14 IU/L (ref 0–32)
AST: 19 IU/L (ref 0–40)
Albumin: 4.2 g/dL (ref 3.5–5.5)
BILIRUBIN TOTAL: 0.4 mg/dL (ref 0.0–1.2)
BUN / CREAT RATIO: 23 (ref 9–23)
BUN: 19 mg/dL (ref 6–24)
CO2: 28 mmol/L (ref 18–29)
Calcium: 9 mg/dL (ref 8.7–10.2)
Chloride: 100 mmol/L (ref 97–108)
Creatinine, Ser: 0.82 mg/dL (ref 0.57–1.00)
GFR, EST AFRICAN AMERICAN: 95 mL/min/{1.73_m2} (ref >59)
GFR, EST NON AFRICAN AMERICAN: 83 mL/min/{1.73_m2} (ref >59)
GLUCOSE: 90 mg/dL (ref 65–99)
Globulin, Total: 1.7 g/dL (ref 1.5–4.5)
Potassium: 4 mmol/L (ref 3.5–5.2)
SODIUM: 139 mmol/L (ref 134–144)
TOTAL PROTEIN: 5.9 g/dL — AB (ref 6.0–8.5)

## 2013-09-24 ENCOUNTER — Encounter: Payer: Self-pay | Admitting: *Deleted

## 2013-11-04 ENCOUNTER — Telehealth: Payer: Self-pay | Admitting: *Deleted

## 2013-11-04 DIAGNOSIS — F341 Dysthymic disorder: Secondary | ICD-10-CM

## 2013-11-04 NOTE — Telephone Encounter (Signed)
Pt came in stating that RX for Clonazepam is incorrect It should be for #60 Please review and advise

## 2013-11-05 MED ORDER — CLONAZEPAM 1 MG PO TABS: 1.0000 mg | ORAL_TABLET | Freq: Two times a day (BID) | ORAL | Status: DC

## 2013-11-05 NOTE — Telephone Encounter (Signed)
Pt needs to turn in other RX

## 2013-11-05 NOTE — Telephone Encounter (Signed)
Pt aware corrected Rx is ready to be picked up and that she needs to bring the incorrect Rx back with her.

## 2014-02-02 ENCOUNTER — Other Ambulatory Visit: Payer: Self-pay | Admitting: Family

## 2014-02-02 NOTE — Telephone Encounter (Signed)
Last filled 01/02/14, last seen 09/22/13, Saw Christy. [facility] uses [facility]

## 2014-02-02 NOTE — Telephone Encounter (Signed)
Please call in klonopin with 0 refills 

## 2014-03-27 ENCOUNTER — Ambulatory Visit (INDEPENDENT_AMBULATORY_CARE_PROVIDER_SITE_OTHER): Payer: 59 | Admitting: Family Medicine

## 2014-03-27 ENCOUNTER — Ambulatory Visit (INDEPENDENT_AMBULATORY_CARE_PROVIDER_SITE_OTHER): Payer: 59

## 2014-03-27 ENCOUNTER — Encounter: Payer: Self-pay | Admitting: Family Medicine

## 2014-03-27 VITALS — BP 104/63 | HR 64 | Temp 97.3°F | Ht 64.0 in | Wt 143.4 lb

## 2014-03-27 DIAGNOSIS — J4541 Moderate persistent asthma with (acute) exacerbation: Secondary | ICD-10-CM

## 2014-03-27 MED ORDER — ALBUTEROL SULFATE (2.5 MG/3ML) 0.083% IN NEBU: 2.5000 mg | INHALATION_SOLUTION | Freq: Four times a day (QID) | RESPIRATORY_TRACT | Status: DC | PRN

## 2014-03-27 MED ORDER — ALBUTEROL SULFATE HFA 108 (90 BASE) MCG/ACT IN AERS: 1.0000 | INHALATION_SPRAY | Freq: Four times a day (QID) | RESPIRATORY_TRACT | Status: DC | PRN

## 2014-03-27 MED ORDER — ALBUTEROL SULFATE (2.5 MG/3ML) 0.083% IN NEBU
2.5000 mg | INHALATION_SOLUTION | Freq: Once | RESPIRATORY_TRACT | Status: AC
  Administered 2014-03-27: 2.5 mg via RESPIRATORY_TRACT

## 2014-03-27 MED ORDER — LEVOFLOXACIN 500 MG PO TABS: 500.0000 mg | ORAL_TABLET | Freq: Every day | ORAL | Status: DC

## 2014-03-27 MED ORDER — METHYLPREDNISOLONE ACETATE 80 MG/ML IJ SUSP
80.0000 mg | Freq: Once | INTRAMUSCULAR | Status: AC
  Administered 2014-03-27: 80 mg via INTRAMUSCULAR

## 2014-03-27 MED ORDER — METHYLPREDNISOLONE SODIUM SUCC 125 MG IJ SOLR
80.0000 mg | Freq: Once | INTRAMUSCULAR | Status: AC
  Administered 2014-03-27: 80 mg via INTRAMUSCULAR

## 2014-03-27 NOTE — Progress Notes (Signed)
   Subjective:    Patient ID: Sandra FermoCynthia R Line, female    DOB: 04/28/1960, 54 y.o.   MRN: [id number]  HPI  Pt is here today with head and chest congestion with wheezing and cough.  This started before Christmas but has not resolved and is getting worse per pt. patient has had significantly increased recently and her wheezing and cough. She states that she is also producing scant amounts of yellow sputum. She is significantly short of breath. This has been worsening over the last 24 hours significantly. She has been dealing with some congestion for 3 weeks or more. However the wheezing became far more pronounced while she was laying on the couch for a while one day earlier this week. She doesn't remember what day. She denies fever or chills and sweats. She has no known exposure to allergens. However she has a history of year-round allergy and she no longer takes her allergy desensitization shots.       Review of Systems  Constitutional: Negative for fever, chills, diaphoresis, appetite change, fatigue and unexpected weight change.  HENT: Negative for congestion, ear pain, hearing loss, postnasal drip, rhinorrhea, sneezing, sore throat and trouble swallowing.   Eyes: Negative for pain.  Respiratory: Positive for cough, chest tightness, shortness of breath and wheezing.   Cardiovascular: Negative for chest pain and palpitations.  Gastrointestinal: Negative for nausea, vomiting, abdominal pain, diarrhea and constipation.  Genitourinary: Negative for dysuria, frequency and menstrual problem.  Musculoskeletal: Negative for joint swelling and arthralgias.  Skin: Negative for rash.  Neurological: Positive for tremors. Negative for dizziness, weakness, numbness and headaches.  Psychiatric/Behavioral: Negative for dysphoric mood and agitation.       Objective:   Physical Exam  Constitutional: She is oriented to person, place, and time. She appears well-developed and well-nourished. No distress.    HENT:  Head: Normocephalic and atraumatic.  Right Ear: External ear normal.  Left Ear: External ear normal.  Nose: Nose normal.  Mouth/Throat: Oropharynx is clear and moist.  Eyes: Conjunctivae and EOM are normal. Pupils are equal, round, and reactive to light.  Neck: Normal range of motion. Neck supple. No thyromegaly present.  Cardiovascular: Normal rate, regular rhythm and normal heart sounds.   No murmur heard. Pulmonary/Chest: Effort normal. She has wheezes. She has no rales.  There was some diminished expiratory phase throughout the lung fields. No rales  Abdominal: Soft. Bowel sounds are normal. She exhibits no distension. There is no tenderness.  Lymphadenopathy:    She has no cervical adenopathy.  Neurological: She is alert and oriented to person, place, and time. She has normal reflexes.  Skin: Skin is warm and dry.  Psychiatric: She has a normal mood and affect. Her behavior is normal. Judgment and thought content normal.   BP 104/63 mmHg  Pulse 64  Temp(Src) 97.3 F (36.3 C) (Oral)  Ht 5\' 4"  (1.626 m)  Wt 143 lb 6.4 oz (65.046 kg)  BMI 24.60 kg/m2          Assessment & Plan:  Asthma with acute exacerbation, moderate persistent - Plan: DG Chest 2 View, methylPREDNISolone sodium succinate (SOLU-MEDROL) 125 mg/2 mL injection 80 mg, methylPREDNISolone acetate (DEPO-MEDROL) injection 80 mg, albuterol (PROVENTIL) (2.5 MG/3ML) 0.083% nebulizer solution 2.5 mg, levofloxacin (LEVAQUIN) 500 MG tablet, albuterol (PROVENTIL HFA;VENTOLIN HFA) 108 (90 BASE) MCG/ACT inhaler, albuterol (PROVENTIL) (2.5 MG/3ML) 0.083% nebulizer solution

## 2014-03-27 NOTE — Patient Instructions (Signed)
Use nebulizer treatments 4 times daily until symptoms clear. She may have to back off sooner if the tremors worsen. If she is away from home and symptoms are mild she can substitute the inhaler.

## 2014-04-10 ENCOUNTER — Other Ambulatory Visit: Payer: Self-pay | Admitting: Nurse Practitioner

## 2014-04-10 NOTE — Telephone Encounter (Signed)
Last filled 03/09/14, last seen 09/22/13. If approved, route to pool A, [facility]

## 2014-04-10 NOTE — Telephone Encounter (Signed)
rx called into pharmacy

## 2014-07-31 ENCOUNTER — Other Ambulatory Visit: Payer: Self-pay | Admitting: Family

## 2014-08-12 ENCOUNTER — Other Ambulatory Visit: Payer: Self-pay | Admitting: Family

## 2014-08-13 NOTE — Telephone Encounter (Signed)
Please review and advise.

## 2014-08-13 NOTE — Telephone Encounter (Signed)
Last seen 03/27/14 Dr Darlyn ReadStacks  If approved route to nurse to [facility]

## 2014-08-15 NOTE — Telephone Encounter (Signed)
RX called in by [employer] Deleon

## 2014-09-24 ENCOUNTER — Other Ambulatory Visit: Payer: Self-pay | Admitting: Family

## 2014-09-24 NOTE — Telephone Encounter (Signed)
Last seen 03/27/14 Dr Darlyn ReadStacks   Requesting a 90 day supply

## 2014-10-02 ENCOUNTER — Encounter (INDEPENDENT_AMBULATORY_CARE_PROVIDER_SITE_OTHER): Payer: Self-pay

## 2014-10-02 ENCOUNTER — Encounter: Payer: Self-pay | Admitting: Family Medicine

## 2014-10-02 ENCOUNTER — Ambulatory Visit (INDEPENDENT_AMBULATORY_CARE_PROVIDER_SITE_OTHER): Payer: BLUE CROSS/BLUE SHIELD | Admitting: Family Medicine

## 2014-10-02 ENCOUNTER — Other Ambulatory Visit: Payer: Self-pay | Admitting: Family

## 2014-10-02 VITALS — BP 107/61 | HR 64 | Temp 98.0°F | Ht 64.0 in | Wt 126.8 lb

## 2014-10-02 DIAGNOSIS — J302 Other seasonal allergic rhinitis: Secondary | ICD-10-CM | POA: Diagnosis not present

## 2014-10-02 DIAGNOSIS — F341 Dysthymic disorder: Secondary | ICD-10-CM | POA: Diagnosis not present

## 2014-10-02 MED ORDER — CLONAZEPAM 1 MG PO TABS: 1.0000 mg | ORAL_TABLET | Freq: Two times a day (BID) | ORAL | Status: DC

## 2014-10-02 MED ORDER — MONTELUKAST SODIUM 10 MG PO TABS: 10.0000 mg | ORAL_TABLET | Freq: Every day | ORAL | Status: DC

## 2014-10-02 MED ORDER — PAROXETINE HCL 40 MG PO TABS: 40.0000 mg | ORAL_TABLET | ORAL | Status: DC

## 2014-10-02 NOTE — Progress Notes (Signed)
Subjective:  Patient ID: Sandra Deleon, female    DOB: 23-Aug-1960  Age: 54 y.o. MRN: [id number]  CC: GAD   HPI Sandra Deleon presents for follow-up on her anxiety. She has had dramatic improvement with treatment she uses to Paxil daily. She is continuing to use the clonazepam periodically. However she has been happy and free of anxiety for several months. Today she just needs her medication refilled.  History Sandra Deleon has a past medical history of Asthma; Anxiety; GERD (gastroesophageal reflux disease); and Hiatal hernia.   She has past surgical history that includes Nose surgery; Esophagogastroduodenoscopy (04/09/2008); Esophagogastroduodenoscopy (04/25/2005); Partial hysterectomy; and Vagina reconstruction surgery.   Her family history includes Heart disease in her father and sister; Hypertension in her brother.She reports that she has never smoked. She has never used smokeless tobacco. She reports that she does not drink alcohol or use illicit drugs.  Outpatient Prescriptions Prior to Visit  Medication Sig Dispense Refill  . Multiple Vitamin (MULTIVITAMIN) tablet Take 1 tablet by mouth daily.    . clonazePAM (KLONOPIN) 1 MG tablet TAKE 1 TABLET TWICE A DAY 60 tablet 0  . montelukast (SINGULAIR) 10 MG tablet Take 1 tablet (10 mg total) by mouth at bedtime. 90 tablet 3  . PARoxetine (PAXIL) 40 MG tablet Take 1 tablet (40 mg total) by mouth every morning. 90 tablet 3  . albuterol (PROVENTIL HFA;VENTOLIN HFA) 108 (90 BASE) MCG/ACT inhaler Inhale 1-2 puffs into the lungs every 6 (six) hours as needed for wheezing. (Patient not taking: Reported on 10/02/2014) 1 Inhaler 6  . albuterol (PROVENTIL) (2.5 MG/3ML) 0.083% nebulizer solution Take 3 mLs (2.5 mg total) by nebulization every 6 (six) hours as needed for wheezing or shortness of breath. (Patient not taking: Reported on 10/02/2014) 150 mL 1  . levofloxacin (LEVAQUIN) 500 MG tablet Take 1 tablet (500 mg total) by mouth daily. (Patient not  taking: Reported on 10/02/2014) 7 tablet 0   No facility-administered medications prior to visit.    ROS Review of Systems  Constitutional: Negative for fever, chills, diaphoresis, appetite change and fatigue.  HENT: Negative for congestion, ear pain, hearing loss, postnasal drip, rhinorrhea, sore throat and trouble swallowing.   Respiratory: Negative for cough, chest tightness and shortness of breath.   Cardiovascular: Negative for chest pain and palpitations.  Gastrointestinal: Negative for abdominal pain.  Musculoskeletal: Negative for arthralgias.  Skin: Negative for rash.  Psychiatric/Behavioral: Negative.     Objective:  BP 107/61 mmHg  Pulse 64  Temp(Src) 98 F (36.7 C) (Oral)  Ht 5\' 4"  (1.626 m)  Wt 126 lb 12.8 oz (57.516 kg)  BMI 21.75 kg/m2  BP Readings from Last 3 Encounters:  10/02/14 107/61  03/27/14 104/63  09/22/13 99/63    Wt Readings from Last 3 Encounters:  10/02/14 126 lb 12.8 oz (57.516 kg)  03/27/14 143 lb 6.4 oz (65.046 kg)  09/22/13 136 lb 9.6 oz (61.961 kg)     Physical Exam  Constitutional: She is oriented to person, place, and time. She appears well-developed and well-nourished. No distress.  HENT:  Head: Normocephalic and atraumatic.  Eyes: Conjunctivae are normal. Pupils are equal, round, and reactive to light.  Neck: Normal range of motion. Neck supple. No thyromegaly present.  Cardiovascular: Normal rate, regular rhythm and normal heart sounds.   No murmur heard. Pulmonary/Chest: Effort normal and breath sounds normal. No respiratory distress. She has no wheezes. She has no rales.  Abdominal: Soft. Bowel sounds are normal. She exhibits no distension.  There is no tenderness.  Musculoskeletal: Normal range of motion.  Lymphadenopathy:    She has no cervical adenopathy.  Neurological: She is alert and oriented to person, place, and time.  Skin: Skin is warm and dry.  Psychiatric: She has a normal mood and affect. Her behavior is normal.  Judgment and thought content normal.    No results found for: HGBA1C  Lab Results  Component Value Date   GLUCOSE 90 09/22/2013   ALT 14 09/22/2013   AST 19 09/22/2013   NA 139 09/22/2013   K 4.0 09/22/2013   CL 100 09/22/2013   CREATININE 0.82 09/22/2013   BUN 19 09/22/2013   CO2 28 09/22/2013    Dg Chest Portable 1 View  05/03/2011   *RADIOLOGY REPORT*  Clinical Data: Shortness of breath and dysphagia; history of asthma.  PORTABLE CHEST - 1 VIEW  Comparison: None.  Findings: The lungs are well-aerated and clear.  There is no evidence of focal opacification, pleural effusion or pneumothorax.  The cardiomediastinal silhouette is within normal limits.  No acute osseous abnormalities are seen.  IMPRESSION: No acute cardiopulmonary process seen.  Original Report Authenticated By: Tonia Ghent, M.D.   Assessment & Plan:   Sandra Deleon was seen today for gad.  Diagnoses and all orders for this visit:  DEPRESSION/ANXIETY Orders: -     PARoxetine (PAXIL) 40 MG tablet; Take 1 tablet (40 mg total) by mouth every morning.  Seasonal allergies Orders: -     montelukast (SINGULAIR) 10 MG tablet; Take 1 tablet (10 mg total) by mouth at bedtime.  Other orders -     Discontinue: clonazePAM (KLONOPIN) 1 MG tablet; Take 1 tablet (1 mg total) by mouth 2 (two) times daily. -     clonazePAM (KLONOPIN) 1 MG tablet; Take 1 tablet (1 mg total) by mouth 2 (two) times daily.   I have discontinued Sandra Deleon's levofloxacin. I am also having her maintain her multivitamin, albuterol, albuterol, PARoxetine, montelukast, and clonazePAM.  Meds ordered this encounter  Medications  . PARoxetine (PAXIL) 40 MG tablet    Sig: Take 1 tablet (40 mg total) by mouth every morning.    Dispense:  90 tablet    Refill:  3  . montelukast (SINGULAIR) 10 MG tablet    Sig: Take 1 tablet (10 mg total) by mouth at bedtime.    Dispense:  90 tablet    Refill:  3  . DISCONTD: clonazePAM (KLONOPIN) 1 MG tablet    Sig:  Take 1 tablet (1 mg total) by mouth 2 (two) times daily.    Dispense:  60 tablet    Refill:  2    Not to exceed 4 additional fills before 10/07/2014  . clonazePAM (KLONOPIN) 1 MG tablet    Sig: Take 1 tablet (1 mg total) by mouth 2 (two) times daily.    Dispense:  60 tablet    Refill:  5    Not to exceed 4 additional fills before 10/07/2014     Follow-up: Return in about 6 months (around 04/04/2015).  Mechele Claude, M.D.

## 2015-03-29 ENCOUNTER — Other Ambulatory Visit: Payer: Self-pay | Admitting: *Deleted

## 2015-03-29 MED ORDER — CLONAZEPAM 1 MG PO TABS: 1.0000 mg | ORAL_TABLET | Freq: Two times a day (BID) | ORAL | Status: DC

## 2015-03-29 NOTE — Telephone Encounter (Signed)
Last filled 02/26/15, last seen 10/02/14. If approved [facility]

## 2015-03-30 NOTE — Telephone Encounter (Signed)
Script left on vm.

## 2015-04-13 ENCOUNTER — Ambulatory Visit (INDEPENDENT_AMBULATORY_CARE_PROVIDER_SITE_OTHER): Payer: BLUE CROSS/BLUE SHIELD | Admitting: Family Medicine

## 2015-04-13 ENCOUNTER — Encounter: Payer: Self-pay | Admitting: Family Medicine

## 2015-04-13 VITALS — BP 107/65 | HR 63 | Temp 98.0°F | Ht 64.0 in | Wt 134.4 lb

## 2015-04-13 DIAGNOSIS — K2100 Gastro-esophageal reflux disease with esophagitis, without bleeding: Secondary | ICD-10-CM

## 2015-04-13 DIAGNOSIS — J4541 Moderate persistent asthma with (acute) exacerbation: Secondary | ICD-10-CM

## 2015-04-13 MED ORDER — PANTOPRAZOLE SODIUM 40 MG PO TBEC: 40.0000 mg | DELAYED_RELEASE_TABLET | Freq: Two times a day (BID) | ORAL | Status: DC

## 2015-04-13 MED ORDER — ALBUTEROL SULFATE HFA 108 (90 BASE) MCG/ACT IN AERS: 1.0000 | INHALATION_SPRAY | Freq: Four times a day (QID) | RESPIRATORY_TRACT | Status: DC | PRN

## 2015-04-13 MED ORDER — CLONAZEPAM 1 MG PO TABS: 1.0000 mg | ORAL_TABLET | Freq: Two times a day (BID) | ORAL | Status: DC

## 2015-04-13 MED ORDER — ALBUTEROL SULFATE (2.5 MG/3ML) 0.083% IN NEBU: 2.5000 mg | INHALATION_SOLUTION | Freq: Four times a day (QID) | RESPIRATORY_TRACT | Status: DC | PRN

## 2015-04-13 NOTE — Addendum Note (Signed)
Addended by: Bearl Mulberry on: 04/13/2015 05:17 PM   Modules accepted: Orders

## 2015-04-13 NOTE — Progress Notes (Signed)
Subjective:  Patient ID: Sandra Deleon, female    DOB: June 08, 1960  Age: 55 y.o. MRN: [id number]  CC: Gastroesophageal Reflux   HPI Sandra Deleon presents for gastro-esophageal reflux symptoms SUBJECTIVE: Sandra Deleon is a 55 y.o. female who complains of GERD type symptoms. She has been experiencing fullness after meals, abdominal bloating, heartburn, nocturnal burning, upper abdominal discomfort, chest pain, dysphagia for many year(s), chronic over time.  Current Outpatient Prescriptions  Medication Sig Dispense Refill  . clonazePAM (KLONOPIN) 1 MG tablet Take 1 tablet (1 mg total) by mouth 2 (two) times daily. 60 tablet 5  . montelukast (SINGULAIR) 10 MG tablet Take 1 tablet (10 mg total) by mouth at bedtime. 90 tablet 3  . Multiple Vitamin (MULTIVITAMIN) tablet Take 1 tablet by mouth daily.    Marland Kitchen PARoxetine (PAXIL) 40 MG tablet Take 1 tablet (40 mg total) by mouth every morning. 90 tablet 3  . albuterol (PROVENTIL HFA;VENTOLIN HFA) 108 (90 BASE) MCG/ACT inhaler Inhale 1-2 puffs into the lungs every 6 (six) hours as needed for wheezing. (Patient not taking: Reported on 10/02/2014) 1 Inhaler 6  . albuterol (PROVENTIL) (2.5 MG/3ML) 0.083% nebulizer solution Take 3 mLs (2.5 mg total) by nebulization every 6 (six) hours as needed for wheezing or shortness of breath. (Patient not taking: Reported on 10/02/2014) 150 mL 1  . pantoprazole (PROTONIX) 40 MG tablet Take 1 tablet (40 mg total) by mouth 2 (two) times daily. 60 tablet 2   No current facility-administered medications for this visit.    History Sandra Deleon has a past medical history of Asthma; Anxiety; GERD (gastroesophageal reflux disease); and Hiatal hernia.   She has past surgical history that includes Nose surgery; Esophagogastroduodenoscopy (04/09/2008); Esophagogastroduodenoscopy (04/25/2005); Partial hysterectomy; and Vagina reconstruction surgery.   Her family history includes Heart disease in her father and sister;  Hypertension in her brother.She reports that she has never smoked. She has never used smokeless tobacco. She reports that she does not drink alcohol or use illicit drugs.    ROS Review of Systems  Constitutional: Negative for fever, activity change and appetite change.  HENT: Negative for congestion, rhinorrhea, sore throat and trouble swallowing.   Eyes: Negative for visual disturbance.  Respiratory: Negative for cough and shortness of breath.   Cardiovascular: Negative for chest pain and palpitations.  Gastrointestinal: Positive for abdominal pain and abdominal distention. Negative for nausea and diarrhea.  Genitourinary: Negative for dysuria.  Musculoskeletal: Negative for myalgias and arthralgias.  Psychiatric/Behavioral: The patient is nervous/anxious.     Objective:  BP 107/65 mmHg  Pulse 63  Temp(Src) 98 F (36.7 C) (Oral)  Ht  (1.626 m)  Wt 134 lb 6.4 oz (60.963 kg)  BMI 23.06 kg/m2  SpO2 100%  BP Readings from Last 3 Encounters:  04/13/15 107/65  10/02/14 107/61  03/27/14 104/63    Wt Readings from Last 3 Encounters:  04/13/15 134 lb 6.4 oz (60.963 kg)  10/02/14 126 lb 12.8 oz (57.516 kg)  03/27/14 143 lb 6.4 oz (65.046 kg)     Physical Exam  Constitutional: She is oriented to person, place, and time. She appears well-developed and well-nourished. No distress.  HENT:  Head: Normocephalic and atraumatic.  Right Ear: External ear normal.  Left Ear: External ear normal.  Nose: Nose normal.  Mouth/Throat: Oropharynx is clear and moist.  Eyes: Conjunctivae and EOM are normal. Pupils are equal, round, and reactive to light.  Neck: Normal range of motion. Neck supple. No thyromegaly present.  Cardiovascular:  Normal rate, regular rhythm and normal heart sounds.   No murmur heard. Pulmonary/Chest: Effort normal and breath sounds normal. No respiratory distress. She has no wheezes. She has no rales.  Abdominal: Soft. Bowel sounds are normal. She exhibits no  distension. There is tenderness (moderate at epigastrum).  Lymphadenopathy:    She has no cervical adenopathy.  Neurological: She is alert and oriented to person, place, and time. She has normal reflexes.  Skin: Skin is warm and dry.  Psychiatric: She has a normal mood and affect. Her behavior is normal. Judgment and thought content normal.     Lab Results  Component Value Date   GLUCOSE 90 09/22/2013   ALT 14 09/22/2013   AST 19 09/22/2013   NA 139 09/22/2013   K 4.0 09/22/2013   CL 100 09/22/2013   CREATININE 0.82 09/22/2013   BUN 19 09/22/2013   CO2 28 09/22/2013    Dg Chest Portable 1 View  05/03/2011  *RADIOLOGY REPORT* Clinical Data: Shortness of breath and dysphagia; history of asthma. PORTABLE CHEST - 1 VIEW Comparison: None. Findings: The lungs are well-aerated and clear.  There is no evidence of focal opacification, pleural effusion or pneumothorax. The cardiomediastinal silhouette is within normal limits.  No acute osseous abnormalities are seen. IMPRESSION: No acute cardiopulmonary process seen. Original Report Authenticated By: Tonia Ghent, M.D.   Assessment & Plan:   Sandra Deleon was seen today for gastroesophageal reflux.  Diagnoses and all orders for this visit:  Gastroesophageal reflux disease with esophagitis  Asthma with acute exacerbation, moderate persistent  Other orders -     pantoprazole (PROTONIX) 40 MG tablet; Take 1 tablet (40 mg total) by mouth 2 (two) times daily. -     clonazePAM (KLONOPIN) 1 MG tablet; Take 1 tablet (1 mg total) by mouth 2 (two) times daily.     I am having Sandra Deleon start on pantoprazole. I am also having her maintain her multivitamin, albuterol, albuterol, PARoxetine, montelukast, and clonazePAM.  Meds ordered this encounter  Medications  . pantoprazole (PROTONIX) 40 MG tablet    Sig: Take 1 tablet (40 mg total) by mouth 2 (two) times daily.    Dispense:  60 tablet    Refill:  2  . clonazePAM (KLONOPIN) 1 MG tablet     Sig: Take 1 tablet (1 mg total) by mouth 2 (two) times daily.    Dispense:  60 tablet    Refill:  5     Follow-up: Return in about 3 months (around 07/11/2015).  Mechele Claude, M.D.

## 2015-04-14 ENCOUNTER — Telehealth: Payer: Self-pay | Admitting: Family Medicine

## 2015-04-14 NOTE — Telephone Encounter (Signed)
Left message with Dr Darlyn Read recommendation

## 2015-04-14 NOTE — Telephone Encounter (Signed)
Please contact the patient to let her know that her insurance limitations are not going to allow two a day. She can buy an over-the-counter lansoprazole and use that as her second dose of the day.

## 2015-04-14 NOTE — Telephone Encounter (Signed)
Please review and advise.

## 2015-04-15 ENCOUNTER — Telehealth: Payer: Self-pay

## 2015-04-15 ENCOUNTER — Telehealth: Payer: Self-pay | Admitting: Family Medicine

## 2015-04-15 MED ORDER — PANTOPRAZOLE SODIUM 40 MG PO TBEC: 40.0000 mg | DELAYED_RELEASE_TABLET | Freq: Every day | ORAL | Status: DC

## 2015-04-15 NOTE — Telephone Encounter (Signed)
Insurance prior authorized Pantoprazole  

## 2015-04-15 NOTE — Telephone Encounter (Signed)
Stp and she needed the protonix sent over to pharmacy with directions of taking daily instead of twice a day due to insurance not covering twice daily. Rx sent over as requested.

## 2015-05-03 ENCOUNTER — Other Ambulatory Visit: Payer: Self-pay | Admitting: Family Medicine

## 2015-05-03 ENCOUNTER — Telehealth: Payer: Self-pay | Admitting: Family Medicine

## 2015-05-03 NOTE — Telephone Encounter (Signed)
dont think this is for prior auth, was it already done?

## 2015-05-03 NOTE — Telephone Encounter (Signed)
Pt notified Rx for Klonopin called into [facility] Verbalizes understanding

## 2015-05-04 NOTE — Telephone Encounter (Signed)
Patient last seen in office on 04-13-15. Rx last filled on 04-13-15 for #30. Please advise and route to Pool B so nurse can phone into pharmacy

## 2015-05-10 ENCOUNTER — Encounter: Payer: Self-pay | Admitting: Family Medicine

## 2015-05-10 ENCOUNTER — Ambulatory Visit (INDEPENDENT_AMBULATORY_CARE_PROVIDER_SITE_OTHER): Payer: BLUE CROSS/BLUE SHIELD | Admitting: Family Medicine

## 2015-05-10 VITALS — BP 121/67 | HR 66 | Temp 97.5°F | Ht 64.0 in | Wt 133.2 lb

## 2015-05-10 DIAGNOSIS — J0101 Acute recurrent maxillary sinusitis: Secondary | ICD-10-CM | POA: Diagnosis not present

## 2015-05-10 MED ORDER — BETAMETHASONE SOD PHOS & ACET 6 (3-3) MG/ML IJ SUSP
6.0000 mg | Freq: Once | INTRAMUSCULAR | Status: AC
  Administered 2015-05-10: 6 mg via INTRAMUSCULAR

## 2015-05-10 MED ORDER — LEVOFLOXACIN 750 MG PO TABS: 750.0000 mg | ORAL_TABLET | Freq: Every day | ORAL | Status: DC

## 2015-05-10 NOTE — Progress Notes (Signed)
Subjective:  Patient ID: Sandra Deleon, female    DOB: Jun 04, 1960  Age: 55 y.o. MRN: [id number]  CC: Sinusitis   HPI Sandra Deleon presents for Patient presents with upper respiratory congestion. Rhinorrhea that is frequently purulent. There is moderate sore throat. Patient reports coughing frequently as well.-colored/purulent sputum noted. There is no fever no chills no sweats. The patient denies being short of breath. Onset was 6 days ago. Gradually worsening in spite of home remedies. Facial, neck, ears, gum pain. Yellow snot.   History Sandra Deleon has a past medical history of Asthma; Anxiety; GERD (gastroesophageal reflux disease); and Hiatal hernia.   Sandra Deleon has past surgical history that includes Nose surgery; Esophagogastroduodenoscopy (04/09/2008); Esophagogastroduodenoscopy (04/25/2005); Partial hysterectomy; and Vagina reconstruction surgery.   Her family history includes Heart disease in her father and sister; Hypertension in her brother.Sandra Deleon reports that Sandra Deleon has never smoked. Sandra Deleon has never used smokeless tobacco. Sandra Deleon reports that Sandra Deleon does not drink alcohol or use illicit drugs.  Current Outpatient Prescriptions on File Prior to Visit  Medication Sig Dispense Refill  . albuterol (PROVENTIL HFA;VENTOLIN HFA) 108 (90 Base) MCG/ACT inhaler Inhale 1-2 puffs into the lungs every 6 (six) hours as needed for wheezing. 1 Inhaler 6  . albuterol (PROVENTIL) (2.5 MG/3ML) 0.083% nebulizer solution Take 3 mLs (2.5 mg total) by nebulization every 6 (six) hours as needed for wheezing or shortness of breath. 150 mL 1  . clonazePAM (KLONOPIN) 1 MG tablet Take 1 tablet (1 mg total) by mouth 2 (two) times daily. 60 tablet 5  . montelukast (SINGULAIR) 10 MG tablet Take 1 tablet (10 mg total) by mouth at bedtime. 90 tablet 3  . Multiple Vitamin (MULTIVITAMIN) tablet Take 1 tablet by mouth daily.    . pantoprazole (PROTONIX) 40 MG tablet Take 1 tablet (40 mg total) by mouth daily. 30 tablet 6  .  PARoxetine (PAXIL) 40 MG tablet Take 1 tablet (40 mg total) by mouth every morning. 90 tablet 3   No current facility-administered medications on file prior to visit.    ROS Review of Systems  Constitutional: Negative for fever, chills, activity change and appetite change.  HENT: Positive for congestion, postnasal drip, rhinorrhea and sinus pressure. Negative for ear discharge, ear pain, hearing loss, nosebleeds, sneezing and trouble swallowing.   Respiratory: Negative for chest tightness and shortness of breath.   Cardiovascular: Negative for chest pain and palpitations.  Skin: Negative for rash.    Objective:  BP 121/67 mmHg  Pulse 66  Temp(Src) 97.5 F (36.4 C) (Oral)  Ht  (1.626 m)  Wt 133 lb 3.2 oz (60.419 kg)  BMI 22.85 kg/m2  SpO2 98%  Physical Exam  Constitutional: Sandra Deleon appears well-developed and well-nourished.  HENT:  Head: Normocephalic and atraumatic.  Right Ear: Tympanic membrane and external ear normal. No decreased hearing is noted.  Left Ear: Tympanic membrane and external ear normal. No decreased hearing is noted.  Nose: Mucosal edema present. Right sinus exhibits no frontal sinus tenderness. Left sinus exhibits no frontal sinus tenderness.  Mouth/Throat: No oropharyngeal exudate or posterior oropharyngeal erythema.  Neck: No Brudzinski's sign noted.  Pulmonary/Chest: Breath sounds normal. No respiratory distress.  Lymphadenopathy:       Head (right side): No preauricular adenopathy present.       Head (left side): No preauricular adenopathy present.       Right cervical: No superficial cervical adenopathy present.      Left cervical: No superficial cervical adenopathy present.  Assessment & Plan:   Sandra Deleon was seen today for sinusitis.  Diagnoses and all orders for this visit:  Acute recurrent maxillary sinusitis -     betamethasone acetate-betamethasone sodium phosphate (CELESTONE) injection 6 mg; Inject 1 mL (6 mg total) into the muscle  once.  Other orders -     levofloxacin (LEVAQUIN) 750 MG tablet; Take 1 tablet (750 mg total) by mouth daily.   I am having Sandra Deleon start on levofloxacin. I am also having her maintain her multivitamin, PARoxetine, montelukast, clonazePAM, albuterol, albuterol, and pantoprazole. We will continue to administer betamethasone acetate-betamethasone sodium phosphate.  Meds ordered this encounter  Medications  . betamethasone acetate-betamethasone sodium phosphate (CELESTONE) injection 6 mg    Sig:   . levofloxacin (LEVAQUIN) 750 MG tablet    Sig: Take 1 tablet (750 mg total) by mouth daily.    Dispense:  5 tablet    Refill:  0     Follow-up: Return if symptoms worsen or fail to improve.  Mechele Claude, M.D.

## 2015-06-10 ENCOUNTER — Other Ambulatory Visit: Payer: Self-pay | Admitting: Obstetrics and Gynecology

## 2015-06-11 LAB — CYTOLOGY - PAP

## 2015-07-15 ENCOUNTER — Encounter: Payer: Self-pay | Admitting: Internal Medicine

## 2015-09-29 ENCOUNTER — Telehealth: Payer: Self-pay | Admitting: Family Medicine

## 2015-09-30 NOTE — Telephone Encounter (Signed)
Had at [facility]

## 2015-10-02 ENCOUNTER — Other Ambulatory Visit: Payer: Self-pay | Admitting: Family Medicine

## 2015-10-04 NOTE — Telephone Encounter (Signed)
Authorize 30 days only. Then contact the patient letting them know that they will need an appointment before any further prescriptions can be sent in. 

## 2015-10-12 ENCOUNTER — Other Ambulatory Visit: Payer: Self-pay | Admitting: Family Medicine

## 2015-10-12 DIAGNOSIS — J302 Other seasonal allergic rhinitis: Secondary | ICD-10-CM

## 2015-11-10 ENCOUNTER — Other Ambulatory Visit: Payer: Self-pay | Admitting: Family Medicine

## 2015-11-18 ENCOUNTER — Other Ambulatory Visit: Payer: Self-pay | Admitting: Family Medicine

## 2015-12-01 ENCOUNTER — Ambulatory Visit (INDEPENDENT_AMBULATORY_CARE_PROVIDER_SITE_OTHER): Payer: 59 | Admitting: Family Medicine

## 2015-12-01 ENCOUNTER — Encounter: Payer: Self-pay | Admitting: Family Medicine

## 2015-12-01 VITALS — BP 97/64 | HR 61 | Temp 96.8°F | Ht 64.0 in | Wt 130.2 lb

## 2015-12-01 DIAGNOSIS — F341 Dysthymic disorder: Secondary | ICD-10-CM | POA: Diagnosis not present

## 2015-12-01 DIAGNOSIS — J302 Other seasonal allergic rhinitis: Secondary | ICD-10-CM

## 2015-12-01 DIAGNOSIS — K2100 Gastro-esophageal reflux disease with esophagitis, without bleeding: Secondary | ICD-10-CM

## 2015-12-01 DIAGNOSIS — Z23 Encounter for immunization: Secondary | ICD-10-CM | POA: Diagnosis not present

## 2015-12-01 DIAGNOSIS — J4520 Mild intermittent asthma, uncomplicated: Secondary | ICD-10-CM

## 2015-12-01 LAB — CMP14+EGFR
A/G RATIO: 2.3 — AB (ref 1.2–2.2)
ALK PHOS: 51 IU/L (ref 39–117)
ALT: 16 IU/L (ref 0–32)
AST: 27 IU/L (ref 0–40)
Albumin: 4.5 g/dL (ref 3.5–5.5)
BILIRUBIN TOTAL: 0.3 mg/dL (ref 0.0–1.2)
BUN/Creatinine Ratio: 20 (ref 9–23)
BUN: 17 mg/dL (ref 6–24)
CHLORIDE: 101 mmol/L (ref 96–106)
CO2: 29 mmol/L (ref 18–29)
Calcium: 9.5 mg/dL (ref 8.7–10.2)
Creatinine, Ser: 0.87 mg/dL (ref 0.57–1.00)
GFR calc non Af Amer: 76 mL/min/{1.73_m2} (ref >59)
GFR, EST AFRICAN AMERICAN: 87 mL/min/{1.73_m2} (ref >59)
Globulin, Total: 2 g/dL (ref 1.5–4.5)
Glucose: 59 mg/dL — ABNORMAL LOW (ref 65–99)
POTASSIUM: 4.2 mmol/L (ref 3.5–5.2)
Sodium: 143 mmol/L (ref 134–144)
Total Protein: 6.5 g/dL (ref 6.0–8.5)

## 2015-12-01 LAB — CBC WITH DIFFERENTIAL/PLATELET
BASOS ABS: 0 10*3/uL (ref 0.0–0.2)
Basos: 1 %
EOS (ABSOLUTE): 0.2 10*3/uL (ref 0.0–0.4)
Eos: 4 %
HEMATOCRIT: 41.7 % (ref 34.0–46.6)
HEMOGLOBIN: 13.8 g/dL (ref 11.1–15.9)
IMMATURE GRANS (ABS): 0 10*3/uL (ref 0.0–0.1)
IMMATURE GRANULOCYTES: 0 %
LYMPHS ABS: 1.4 10*3/uL (ref 0.7–3.1)
LYMPHS: 35 %
MCH: 30.4 pg (ref 26.6–33.0)
MCHC: 33.1 g/dL (ref 31.5–35.7)
MCV: 92 fL (ref 79–97)
MONOCYTES: 8 %
Monocytes Absolute: 0.3 10*3/uL (ref 0.1–0.9)
NEUTROS PCT: 52 %
Neutrophils Absolute: 2.1 10*3/uL (ref 1.4–7.0)
Platelets: 249 10*3/uL (ref 150–379)
RBC: 4.54 x10E6/uL (ref 3.77–5.28)
RDW: 13.4 % (ref 12.3–15.4)
WBC: 4 10*3/uL (ref 3.4–10.8)

## 2015-12-01 LAB — LIPID PANEL
Chol/HDL Ratio: 1.9 ratio units (ref 0.0–4.4)
Cholesterol, Total: 141 mg/dL (ref 100–199)
HDL: 75 mg/dL (ref >39)
LDL Calculated: 50 mg/dL (ref 0–99)
TRIGLYCERIDES: 81 mg/dL (ref 0–149)
VLDL Cholesterol Cal: 16 mg/dL (ref 5–40)

## 2015-12-01 MED ORDER — MONTELUKAST SODIUM 10 MG PO TABS: 10.0000 mg | ORAL_TABLET | Freq: Every day | ORAL | 2 refills | Status: DC

## 2015-12-01 MED ORDER — PAROXETINE HCL 40 MG PO TABS: ORAL_TABLET | ORAL | 2 refills | Status: DC

## 2015-12-01 MED ORDER — CLONAZEPAM 1 MG PO TABS: 1.0000 mg | ORAL_TABLET | Freq: Two times a day (BID) | ORAL | 5 refills | Status: DC

## 2015-12-01 MED ORDER — PANTOPRAZOLE SODIUM 40 MG PO TBEC: 40.0000 mg | DELAYED_RELEASE_TABLET | Freq: Every day | ORAL | 2 refills | Status: DC

## 2015-12-01 NOTE — Progress Notes (Signed)
Subjective:  Patient ID: Sandra FermoCynthia R Manuele, female    DOB: 05/04/1960  Age: 55 y.o. MRN: [id number]  CC: Depression; Gastroesophageal Reflux; and Medication Refill   HPI Sandra Deleon presents for gastroesophageal reflux diseasePatient in for follow-up of GERD. Currently asymptomatic taking  PPI daily. There is no chest pain or heartburn. No hematemesis and no melena. No dysphagia or choking. Onset is remote. Progression is stable. Complicating factors, none.   Denies depression symptoms but relies on her Paxil. Takes the clonazepam as needed. Without these she feels that her depression scores would be much higher. She says lites does have its ups and downs. Stress from her fibromyalgia tends to lead to her need for these.  Patient states she does not use her inhalers very often. She does rely on her Singulair. She does not have significant shortness of breath as long she does that. She does occasionally use the inhaler if her shortness of breath is moderately severe.  Dep[facility] screen [facility] 2/[facility] 12/01/2015 05/10/2015 10/02/2014 03/27/2014  Decreased Interest 0 0 0 0  Down, Depressed, Hopeless 0 1 0 0  PHQ - 2 Score 0 1 0 0    History Aram BeechamCynthia has a past medical history of Anxiety; Asthma; GERD (gastroesophageal reflux disease); and Hiatal hernia.   She has a past surgical history that includes Nose surgery; Esophagogastroduodenoscopy (04/09/2008); Esophagogastroduodenoscopy (04/25/2005); Partial hysterectomy; and Vagina reconstruction surgery.   Her family history includes Heart disease in her father and sister; Hypertension in her brother.She reports that she has never smoked. She has never used smokeless tobacco. She reports that she does not drink alcohol or use drugs.    ROS Review of Systems  Constitutional: Negative for activity change, appetite change and fever.  HENT: Negative for congestion, rhinorrhea and sore throat.   Eyes: Negative for visual disturbance.  Respiratory:  Negative for cough and shortness of breath.   Cardiovascular: Negative for chest pain and palpitations.  Gastrointestinal: Negative for abdominal pain, diarrhea and nausea.  Genitourinary: Negative for dysuria.  Musculoskeletal: Negative for arthralgias and myalgias.    Objective:  BP 97/64   Pulse 61   Temp (!) 96.8 F (36 C) (Oral)   Ht 5\' 4"  (1.626 m)   Wt 130 lb 3.2 oz (59.1 kg)   BMI 22.35 kg/m   BP Readings from Last 3 Encounters:  12/01/15 97/64  05/10/15 121/67  04/13/15 107/65    Wt Readings from Last 3 Encounters:  12/01/15 130 lb 3.2 oz (59.1 kg)  05/10/15 133 lb 3.2 oz (60.4 kg)  04/13/15 134 lb 6.4 oz (61 kg)     Physical Exam  Constitutional: She is oriented to person, place, and time. She appears well-developed and well-nourished. No distress.  HENT:  Head: Normocephalic and atraumatic.  Right Ear: External ear normal.  Left Ear: External ear normal.  Nose: Nose normal.  Mouth/Throat: Oropharynx is clear and moist.  Eyes: Conjunctivae and EOM are normal. Pupils are equal, round, and reactive to light.  Neck: Normal range of motion. Neck supple. No thyromegaly present.  Cardiovascular: Normal rate, regular rhythm and normal heart sounds.   No murmur heard. Pulmonary/Chest: Effort normal and breath sounds normal. No respiratory distress. She has no wheezes. She has no rales.  Abdominal: Soft. Bowel sounds are normal. She exhibits no distension. There is no tenderness.  Lymphadenopathy:    She has no cervical adenopathy.  Neurological: She is alert and oriented to person, place, and time. She has normal reflexes.  Skin: Skin is warm and dry.  Psychiatric: She has a normal mood and affect. Her behavior is normal. Judgment and thought content normal.     Lab Results  Component Value Date   GLUCOSE 90 09/22/2013   ALT 14 09/22/2013   AST 19 09/22/2013   NA 139 09/22/2013   K 4.0 09/22/2013   CL 100 09/22/2013   CREATININE 0.82 09/22/2013   BUN 19  09/22/2013   CO2 28 09/22/2013    Dg Chest Portable 1 View  Result Date: 05/03/2011 *RADIOLOGY REPORT* Clinical Data: Shortness of breath and dysphagia; history of asthma. PORTABLE CHEST - 1 VIEW Comparison: None. Findings: The lungs are well-aerated and clear.  There is no evidence of focal opacification, pleural effusion or pneumothorax. The cardiomediastinal silhouette is within normal limits.  No acute osseous abnormalities are seen. IMPRESSION: No acute cardiopulmonary process seen. Original Report Authenticated By: Tonia Ghent, M.D.   Assessment & Plan:   Ahnyla was seen today for depression, gastroesophageal reflux and medication refill.  Diagnoses and all orders for this visit:  Seasonal allergies -     montelukast (SINGULAIR) 10 MG tablet; Take 1 tablet (10 mg total) by mouth at bedtime.  Other orders -     pantoprazole (PROTONIX) 40 MG tablet; Take 1 tablet (40 mg total) by mouth daily. -     PARoxetine (PAXIL) 40 MG tablet; TAKE 1 TABLET (40 MG TOTAL) BY MOUTH EVERY MORNING. -     clonazePAM (KLONOPIN) 1 MG tablet; Take 1 tablet (1 mg total) by mouth 2 (two) times daily.    Asthma is stable.  I have discontinued Ms. Gashi's levofloxacin. I am also having her maintain her multivitamin, clonazePAM, albuterol, albuterol, PARoxetine, montelukast, and pantoprazole.  No orders of the defined types were placed in this encounter.    Follow-up: No Follow-up on file.  Mechele Claude, M.D.

## 2016-05-27 ENCOUNTER — Other Ambulatory Visit: Payer: Self-pay | Admitting: Family Medicine

## 2016-06-05 ENCOUNTER — Encounter: Payer: Self-pay | Admitting: Family Medicine

## 2016-06-05 ENCOUNTER — Ambulatory Visit (INDEPENDENT_AMBULATORY_CARE_PROVIDER_SITE_OTHER): Payer: BLUE CROSS/BLUE SHIELD | Admitting: Family Medicine

## 2016-06-05 VITALS — BP 91/58 | HR 67 | Ht 64.0 in | Wt 134.0 lb

## 2016-06-05 DIAGNOSIS — J45909 Unspecified asthma, uncomplicated: Secondary | ICD-10-CM

## 2016-06-05 DIAGNOSIS — F341 Dysthymic disorder: Secondary | ICD-10-CM

## 2016-06-05 DIAGNOSIS — K2100 Gastro-esophageal reflux disease with esophagitis, without bleeding: Secondary | ICD-10-CM

## 2016-06-05 MED ORDER — PANTOPRAZOLE SODIUM 40 MG PO TBEC: 40.0000 mg | DELAYED_RELEASE_TABLET | Freq: Every day | ORAL | 1 refills | Status: DC

## 2016-06-05 MED ORDER — CLONAZEPAM 1 MG PO TABS: 1.0000 mg | ORAL_TABLET | Freq: Two times a day (BID) | ORAL | 5 refills | Status: DC

## 2016-06-05 MED ORDER — PAROXETINE HCL 40 MG PO TABS: ORAL_TABLET | ORAL | 1 refills | Status: DC

## 2016-06-05 MED ORDER — ALBUTEROL SULFATE (2.5 MG/3ML) 0.083% IN NEBU: 2.5000 mg | INHALATION_SOLUTION | Freq: Four times a day (QID) | RESPIRATORY_TRACT | 5 refills | Status: DC | PRN

## 2016-06-05 MED ORDER — ALBUTEROL SULFATE HFA 108 (90 BASE) MCG/ACT IN AERS: 1.0000 | INHALATION_SPRAY | Freq: Four times a day (QID) | RESPIRATORY_TRACT | 6 refills | Status: DC | PRN

## 2016-06-05 NOTE — Progress Notes (Signed)
Subjective:  Patient ID: Sandra Deleon, female    DOB: 19-Jun-1960  Age: 56 y.o. MRN: [id number]  CC: Medication Refill (pt here today for refills on her klonopin)   HPI Sandra Deleon presents for Occasional reflux with some burning in her throat heartburn indigestion and nausea. She admits to using a girdle eating some suspicious foods and trying to hold her belly and while exercising. She did like to adjust these before changing her medications. She is not getting any food stuck in her throat so she does not want to pursue GI referral.  Patient is deeply religious and is using her face to help her face anxieties. However, she still takes her Xanax regularly.  With regard to asthma she takes her monthly cast daily. She has occasional shortness of breath with exertion. She doesn't usually notice it but occasionally her grandkids will say something about her breathing hard. She uses her albuterol inhaler occasionally and has not used the nebulizer solution in a long time.  GAD 7 : Generalized Anxiety Score 06/05/2016  Nervous, Anxious, on Edge 2  Control/stop worrying 0  Worry too much - different things 1  Trouble relaxing 1  Restless 0  Easily annoyed or irritable 0  Afraid - awful might happen 1  Total GAD 7 Score 5  Anxiety Difficulty Not difficult at all    Depression screen [facility] 2/9 06/05/2016 12/01/2015 05/10/2015  Decreased Interest 0 0 0  Down, Depressed, Hopeless 1 0 1  PHQ - 2 Score 1 0 1     History Sandra Deleon has a past medical history of Anxiety; Asthma; GERD (gastroesophageal reflux disease); and Hiatal hernia.   She has a past surgical history that includes Nose surgery; Esophagogastroduodenoscopy (04/09/2008); Esophagogastroduodenoscopy (04/25/2005); Partial hysterectomy; and Vagina reconstruction surgery.   Her family history includes Heart disease in her father and sister; Hypertension in her brother.She reports that she has never smoked. She has never used  smokeless tobacco. She reports that she does not drink alcohol or use drugs.    ROS Review of Systems  Constitutional: Negative for activity change, appetite change and fever.  HENT: Negative for congestion, rhinorrhea and sore throat.   Eyes: Negative for visual disturbance.  Respiratory: Negative for cough and shortness of breath.   Cardiovascular: Negative for chest pain and palpitations.  Gastrointestinal: Negative for abdominal pain, diarrhea and nausea.  Genitourinary: Negative for dysuria.  Musculoskeletal: Negative for arthralgias and myalgias.    Objective:  BP (!) 91/58   Pulse 67   Ht 5\' 4"  (1.626 m)   Wt 134 lb (60.8 kg)   BMI 23.00 kg/m   BP Readings from Last 3 Encounters:  06/05/16 (!) 91/58  12/01/15 97/64  05/10/15 121/67    Wt Readings from Last 3 Encounters:  06/05/16 134 lb (60.8 kg)  12/01/15 130 lb 3.2 oz (59.1 kg)  05/10/15 133 lb 3.2 oz (60.4 kg)     Physical Exam  Constitutional: She is oriented to person, place, and time. She appears well-developed and well-nourished. No distress.  HENT:  Head: Normocephalic and atraumatic.  Eyes: Conjunctivae are normal. Pupils are equal, round, and reactive to light.  Neck: Normal range of motion. Neck supple. No thyromegaly present.  Cardiovascular: Normal rate, regular rhythm and normal heart sounds.   No murmur heard. Pulmonary/Chest: Effort normal and breath sounds normal. No respiratory distress. She has no wheezes. She has no rales.  Abdominal: Soft. Bowel sounds are normal. She exhibits no distension. There is  no tenderness.  Musculoskeletal: Normal range of motion.  Lymphadenopathy:    She has no cervical adenopathy.  Neurological: She is alert and oriented to person, place, and time.  Skin: Skin is warm and dry.  Psychiatric: She has a normal mood and affect. Her behavior is normal. Judgment and thought content normal.      Assessment & Plan:   Sandra Deleon was seen today for medication  refill.  Diagnoses and all orders for this visit:  Uncomplicated asthma, unspecified asthma severity, unspecified whether persistent  DEPRESSION/ANXIETY  Gastroesophageal reflux disease with esophagitis  Other orders -     clonazePAM (KLONOPIN) 1 MG tablet; Take 1 tablet (1 mg total) by mouth 2 (two) times daily. -     PARoxetine (PAXIL) 40 MG tablet; TAKE 1 TABLET (40 MG TOTAL) BY MOUTH EVERY MORNING. -     albuterol (PROVENTIL) (2.5 MG/3ML) 0.083% nebulizer solution; Take 3 mLs (2.5 mg total) by nebulization every 6 (six) hours as needed for wheezing or shortness of breath. -     albuterol (PROVENTIL HFA;VENTOLIN HFA) 108 (90 Base) MCG/ACT inhaler; Inhale 1-2 puffs into the lungs every 6 (six) hours as needed for wheezing. -     pantoprazole (PROTONIX) 40 MG tablet; Take 1 tablet (40 mg total) by mouth daily.       I am having Sandra Deleon maintain her multivitamin, montelukast, clonazePAM, PARoxetine, albuterol, albuterol, and pantoprazole.  Allergies as of 06/05/2016      Reactions   Codeine    rash   Pseudoephedrine    Heart beating through body.      Medication List       Accurate as of 06/05/16 10:48 AM. Always use your most recent med list.          albuterol (2.5 MG/3ML) 0.083% nebulizer solution Commonly known as:  PROVENTIL Take 3 mLs (2.5 mg total) by nebulization every 6 (six) hours as needed for wheezing or shortness of breath.   albuterol 108 (90 Base) MCG/ACT inhaler Commonly known as:  PROVENTIL HFA;VENTOLIN HFA Inhale 1-2 puffs into the lungs every 6 (six) hours as needed for wheezing.   clonazePAM 1 MG tablet Commonly known as:  KLONOPIN Take 1 tablet (1 mg total) by mouth 2 (two) times daily.   montelukast 10 MG tablet Commonly known as:  SINGULAIR Take 1 tablet (10 mg total) by mouth at bedtime.   multivitamin tablet Take 1 tablet by mouth daily.   pantoprazole 40 MG tablet Commonly known as:  PROTONIX Take 1 tablet (40 mg total) by mouth  daily.   PARoxetine 40 MG tablet Commonly known as:  PAXIL TAKE 1 TABLET (40 MG TOTAL) BY MOUTH EVERY MORNING.        Follow-up: Return in about 6 months (around 12/06/2016) for CPE, asthma, anxiety.  Mechele ClaudeWarren Skarlet Lyons, M.D.

## 2016-06-05 NOTE — Addendum Note (Signed)
Addended by: Margurite AuerbachOMPTON, KARLA G on: 06/05/2016 10:57 AM   Modules accepted: Orders

## 2016-09-21 ENCOUNTER — Telehealth: Payer: Self-pay | Admitting: Family Medicine

## 2016-12-15 ENCOUNTER — Other Ambulatory Visit: Payer: Self-pay

## 2016-12-15 MED ORDER — PAROXETINE HCL 40 MG PO TABS: ORAL_TABLET | ORAL | 0 refills | Status: DC

## 2016-12-15 NOTE — Telephone Encounter (Signed)
Patient last seen in March and was to follow up in 6 months. Requesting a 90 day rx. Please advise

## 2016-12-15 NOTE — Telephone Encounter (Signed)
Authorize 30 days only. Then contact the patient letting them know that they will need an appointment before any further prescriptions can be sent in. 

## 2016-12-17 ENCOUNTER — Other Ambulatory Visit: Payer: Self-pay | Admitting: Family Medicine

## 2016-12-17 DIAGNOSIS — J302 Other seasonal allergic rhinitis: Secondary | ICD-10-CM

## 2016-12-19 ENCOUNTER — Other Ambulatory Visit: Payer: Self-pay | Admitting: Family Medicine

## 2016-12-26 ENCOUNTER — Telehealth: Payer: Self-pay | Admitting: Family Medicine

## 2016-12-26 MED ORDER — CLONAZEPAM 1 MG PO TABS: 1.0000 mg | ORAL_TABLET | Freq: Two times a day (BID) | ORAL | 0 refills | Status: DC

## 2016-12-26 NOTE — Telephone Encounter (Signed)
Please SEND IN A ONE WEEK SUPPPLY

## 2016-12-26 NOTE — Telephone Encounter (Signed)
Medication called to pharmacy patient aware. 

## 2017-01-02 ENCOUNTER — Other Ambulatory Visit: Payer: Self-pay | Admitting: Family Medicine

## 2017-01-02 ENCOUNTER — Ambulatory Visit (INDEPENDENT_AMBULATORY_CARE_PROVIDER_SITE_OTHER): Payer: BLUE CROSS/BLUE SHIELD | Admitting: Family Medicine

## 2017-01-02 ENCOUNTER — Encounter: Payer: Self-pay | Admitting: Family Medicine

## 2017-01-02 ENCOUNTER — Telehealth: Payer: Self-pay | Admitting: Family Medicine

## 2017-01-02 VITALS — BP 87/56 | HR 73 | Temp 97.7°F | Ht 64.0 in | Wt 135.0 lb

## 2017-01-02 DIAGNOSIS — F341 Dysthymic disorder: Secondary | ICD-10-CM | POA: Diagnosis not present

## 2017-01-02 DIAGNOSIS — Z23 Encounter for immunization: Secondary | ICD-10-CM

## 2017-01-02 DIAGNOSIS — J302 Other seasonal allergic rhinitis: Secondary | ICD-10-CM | POA: Diagnosis not present

## 2017-01-02 DIAGNOSIS — J45909 Unspecified asthma, uncomplicated: Secondary | ICD-10-CM | POA: Diagnosis not present

## 2017-01-02 MED ORDER — CLONAZEPAM 1 MG PO TABS: 1.0000 mg | ORAL_TABLET | Freq: Two times a day (BID) | ORAL | 5 refills | Status: DC

## 2017-01-02 MED ORDER — BUPROPION HCL ER (XL) 150 MG PO TB24: 150.0000 mg | ORAL_TABLET | Freq: Every day | ORAL | 2 refills | Status: DC

## 2017-01-02 MED ORDER — BUSPIRONE HCL 10 MG PO TABS: 10.0000 mg | ORAL_TABLET | Freq: Two times a day (BID) | ORAL | 1 refills | Status: DC

## 2017-01-02 MED ORDER — MONTELUKAST SODIUM 10 MG PO TABS: 10.0000 mg | ORAL_TABLET | Freq: Every day | ORAL | 1 refills | Status: DC

## 2017-01-02 MED ORDER — PAROXETINE HCL 40 MG PO TABS: ORAL_TABLET | ORAL | 1 refills | Status: DC

## 2017-01-02 MED ORDER — PANTOPRAZOLE SODIUM 40 MG PO TBEC: 40.0000 mg | DELAYED_RELEASE_TABLET | Freq: Every day | ORAL | 1 refills | Status: DC

## 2017-01-02 NOTE — Telephone Encounter (Signed)
Please contact the patient and let her know that I had to substitute Wellbutrin for her buspirone. This is because it has a long-term recall. Additionally I sent in 6 months refills on her clonazepam.  Thanks, WS.

## 2017-01-02 NOTE — Progress Notes (Signed)
Subjective:  Patient ID: Sandra FermoCynthia R Lenis, female    DOB: 02/03/1961  Age: 56 y.o. MRN: [id number]  CC: Medication Refill (pt here today for refills on her medication and to discuss depression)   HPI Sandra Deleon presents for Concerns about depression. She keeps a brave face and she's become a good after she says. However she relates that she has felt withdrawn and sad trying to work with a nephew. She is a control freak and Effexor who decompensates when she can't fix things due to the sensation of loss of control. She was recently supposed to [place] her brother and his family. She became anxious and couldn't go because she thinks of her home as her stay place. Her husband who was not in any go on this trip is her safe person. She also tends to feel sad when the weather changes and the days get shorter.  [facility] 01/02/2017 06/05/2016 12/01/2015  Decreased Interest 2 0 0  Down, Depressed, Hopeless 2 1 0  PHQ - 2 Score 4 1 0  Altered sleeping 1 - -  Tired, decreased energy 1 - -  Change in appetite 0 - -  Feeling bad or failure about yourself  0 - -  Trouble concentrating 1 - -  Moving slowly or fidgety/restless 0 - -  Suicidal thoughts 1 - -  PHQ-9 Score 8 - -    History Aram BeechamCynthia has a past medical history of Anxiety; Asthma; GERD (gastroesophageal reflux disease); and Hiatal hernia.   She has a past surgical history that includes Nose surgery; Esophagogastroduodenoscopy (04/09/2008); Esophagogastroduodenoscopy (04/25/2005); Partial hysterectomy; and Vagina reconstruction surgery.   Her family history includes Heart disease in her father and sister; Hypertension in her brother.She reports that she has never smoked. She has never used smokeless tobacco. She reports that she does not drink alcohol or use drugs.    ROS Review of Systems  Constitutional: Negative for activity change, appetite change and fever.  HENT:  Negative for congestion, rhinorrhea and sore throat.   Eyes: Negative for visual disturbance.  Respiratory: Negative for cough and shortness of breath.   Cardiovascular: Negative for chest pain and palpitations.  Gastrointestinal: Negative for abdominal pain, diarrhea and nausea.  Genitourinary: Negative for dysuria.  Musculoskeletal: Negative for arthralgias and myalgias.    Objective:  BP (!) 87/56   Pulse 73   Temp 97.7 F (36.5 C) (Oral)   Ht 5\' 4"  (1.626 m)   Wt 135 lb (61.2 kg)   BMI 23.17 kg/m   BP Readings from Last 3 Encounters:  01/02/17 (!) 87/56  06/05/16 (!) 91/58  12/01/15 97/64    Wt Readings from Last 3 Encounters:  01/02/17 135 lb (61.2 kg)  06/05/16 134 lb (60.8 kg)  12/01/15 130 lb 3.2 oz (59.1 kg)     Physical Exam  Constitutional: She is oriented to person, place, and time. She appears well-developed and well-nourished. No distress.  HENT:  Head: Normocephalic and atraumatic.  Eyes: Pupils are equal, round, and reactive to light. Conjunctivae are normal.  Neck: Normal range of motion. Neck supple. No thyromegaly present.  Cardiovascular: Normal rate, regular rhythm and normal heart sounds.   No murmur heard. Pulmonary/Chest: Effort normal and breath sounds normal. No respiratory distress. She has no wheezes. She has no rales.  Abdominal: Soft. Bowel sounds are normal. She exhibits no distension. There is no tenderness.  Musculoskeletal: Normal range of motion.  Lymphadenopathy:    She has no cervical adenopathy.  Neurological: She is alert and oriented to person, place, and time.  Skin: Skin is warm and dry.  Psychiatric: She has a normal mood and affect. Her behavior is normal. Judgment and thought content normal.      Assessment & Plan:   Linnea was seen today for medication refill.  Diagnoses and all orders for this visit:  DEPRESSION/ANXIETY  Uncomplicated asthma, unspecified asthma severity, unspecified whether  persistent  Seasonal allergies -     montelukast (SINGULAIR) 10 MG tablet; Take 1 tablet (10 mg total) by mouth at bedtime.  Need for immunization against influenza -     Flu Vaccine QUAD 36+ mos IM  Other orders -     PARoxetine (PAXIL) 40 MG tablet; TAKE 1 TABLET (40 MG TOTAL) BY MOUTH EVERY MORNING. -     pantoprazole (PROTONIX) 40 MG tablet; Take 1 tablet (40 mg total) by mouth daily. -     Discontinue: busPIRone (BUSPAR) 10 MG tablet; Take 1 tablet (10 mg total) by mouth 2 (two) times daily.       I am having Ms. Laurie maintain her multivitamin, albuterol, albuterol, montelukast, PARoxetine, and pantoprazole.  Allergies as of 01/02/2017      Reactions   Codeine    rash   Pseudoephedrine    Heart beating through body.      Medication List       Accurate as of 01/02/17  5:56 PM. Always use your most recent med list.          albuterol (2.5 MG/3ML) 0.083% nebulizer solution Commonly known as:  PROVENTIL Take 3 mLs (2.5 mg total) by nebulization every 6 (six) hours as needed for wheezing or shortness of breath.   albuterol 108 (90 Base) MCG/ACT inhaler Commonly known as:  PROVENTIL HFA;VENTOLIN HFA Inhale 1-2 puffs into the lungs every 6 (six) hours as needed for wheezing.   buPROPion 150 MG 24 hr tablet Commonly known as:  WELLBUTRIN XL Take 1 tablet (150 mg total) by mouth daily.   clonazePAM 1 MG tablet Commonly known as:  KLONOPIN Take 1 tablet (1 mg total) by mouth 2 (two) times daily.   montelukast 10 MG tablet Commonly known as:  SINGULAIR Take 1 tablet (10 mg total) by mouth at bedtime.   multivitamin tablet Take 1 tablet by mouth daily.   pantoprazole 40 MG tablet Commonly known as:  PROTONIX Take 1 tablet (40 mg total) by mouth daily.   PARoxetine 40 MG tablet Commonly known as:  PAXIL TAKE 1 TABLET (40 MG TOTAL) BY MOUTH EVERY MORNING.        Follow-up: Return in about 3 months (around 04/04/2017), or if symptoms worsen or fail to  improve, for Depression.  Mechele Claude, M.D.

## 2017-01-02 NOTE — Telephone Encounter (Signed)
Klonopin was called into [facility] and called and left message for pt to call back so I could advise we sent in Wellbutrin to replace Buspar.

## 2017-01-02 NOTE — Telephone Encounter (Signed)
Please advise on refill.

## 2017-01-16 ENCOUNTER — Telehealth: Payer: Self-pay | Admitting: Family Medicine

## 2017-01-16 NOTE — Telephone Encounter (Signed)
Patient states that Wellbutrin made her feel more irritable and increased suicidal thoughts. She stopped taking them last Sunday and has been taking 3 klonopin daily. She states that she feels much better. She used to take 3 daily when she was going to the [facility]. Just wanted to make you aware of what was going on and see if you wanted to make any changes.

## 2017-01-16 NOTE — Telephone Encounter (Signed)
Pt aware.

## 2017-01-16 NOTE — Telephone Encounter (Signed)
Please contact the patient and let her know that so okay to go with a higher dose of Klonopin.  However she will need to see me when current supply runs out if the pharmacy will not give her a refill.  This is because Klonopin is a controlled substance.

## 2017-01-18 ENCOUNTER — Telehealth: Payer: Self-pay | Admitting: Family Medicine

## 2017-01-18 NOTE — Telephone Encounter (Signed)
Pt is needing apt/ was able to schedule

## 2017-01-18 NOTE — Telephone Encounter (Signed)
Phone call taken care of in different encounter.  This encounter will now be closed  

## 2017-01-23 ENCOUNTER — Encounter: Payer: Self-pay | Admitting: Family Medicine

## 2017-01-23 ENCOUNTER — Ambulatory Visit: Payer: BLUE CROSS/BLUE SHIELD | Admitting: Family Medicine

## 2017-01-23 VITALS — BP 93/60 | HR 78 | Temp 97.3°F | Ht 64.0 in | Wt 135.8 lb

## 2017-01-23 DIAGNOSIS — F341 Dysthymic disorder: Secondary | ICD-10-CM | POA: Diagnosis not present

## 2017-01-23 MED ORDER — PAROXETINE HCL 40 MG PO TABS: ORAL_TABLET | ORAL | 0 refills | Status: DC

## 2017-01-23 MED ORDER — CLONAZEPAM 1 MG PO TABS: 1.0000 mg | ORAL_TABLET | Freq: Three times a day (TID) | ORAL | 3 refills | Status: DC

## 2017-01-23 MED ORDER — CLONAZEPAM 1 MG PO TABS: 1.0000 mg | ORAL_TABLET | Freq: Two times a day (BID) | ORAL | 5 refills | Status: DC

## 2017-01-23 NOTE — Progress Notes (Signed)
Subjective:  Patient ID: Sandra Deleon, female    DOB: 01/08/1961  Age: 56 y.o. MRN: [id number]  CC: Medication Management (discussion, change in thoughts)   HPI Sandra FermoCynthia R Deleon presents for  Recheck of her anxiety and depression.  She could not tolerate the Wellbutrin.  It made her very irritable.  She discontinued that and increased her clonazepam to 3 times daily.  Since that time she is felt much better she is more relaxed and less irritable and able to cope.  She requested that increased be made.  She says she has been on it in the past [facility] before.  She states that she knows her body.  She is more familiar with these medicines than most people and would not misuse them.   [facility] 01/23/2017 01/02/2017 06/05/2016  Decreased Interest 0 2 0  Down, Depressed, Hopeless 0 2 1  PHQ - 2 Score 0 4 1  Altered sleeping - 1 -  Tired, decreased energy - 1 -  Change in appetite - 0 -  Feeling bad or failure about yourself  - 0 -  Trouble concentrating - 1 -  Moving slowly or fidgety/restless - 0 -  Suicidal thoughts - 1 -  PHQ-9 Score - 8 -    History Sandra Deleon has a past medical history of Anxiety, Asthma, GERD (gastroesophageal reflux disease), and Hiatal hernia.   She has a past surgical history that includes Nose surgery; Esophagogastroduodenoscopy (04/09/2008); Esophagogastroduodenoscopy (04/25/2005); Partial hysterectomy; and Vagina reconstruction surgery.   Her family history includes Heart disease in her father and sister; Hypertension in her brother.She reports that  has never smoked. she has never used smokeless tobacco. She reports that she does not drink alcohol or use drugs.    ROS Review of Systems  Constitutional: Negative for fever.  HENT: Negative for congestion, rhinorrhea and sore throat.   Respiratory: Negative for cough and shortness of breath.   Cardiovascular: Negative for chest pain and palpitations.  Gastrointestinal:  Negative for abdominal pain.  Musculoskeletal: Negative for arthralgias and myalgias.    Objective:  BP 93/60   Pulse 78   Temp (!) 97.3 F (36.3 C) (Oral)   Ht 5\' 4"  (1.626 m)   Wt 135 lb 12.8 oz (61.6 kg)   BMI 23.31 kg/m   BP Readings from Last 3 Encounters:  01/23/17 93/60  01/02/17 (!) 87/56  06/05/16 (!) 91/58    Wt Readings from Last 3 Encounters:  01/23/17 135 lb 12.8 oz (61.6 kg)  01/02/17 135 lb (61.2 kg)  06/05/16 134 lb (60.8 kg)     Physical Exam  Constitutional: She is oriented to person, place, and time. She appears well-developed and well-nourished. No distress.  HENT:  Head: Normocephalic and atraumatic.  Eyes: Conjunctivae and EOM are normal. Right eye exhibits no discharge. Left eye exhibits no discharge. No scleral icterus.  Neck: Normal range of motion. Neck supple.  Cardiovascular: Normal rate and regular rhythm.  Pulmonary/Chest: Breath sounds normal.  Neurological: She is alert and oriented to person, place, and time.  Skin: Skin is warm and dry.  Psychiatric: She has a normal mood and affect. Her behavior is normal.      Assessment & Plan:   Sandra Deleon was seen today for medication management.  Diagnoses and all orders for this visit:  DEPRESSION/ANXIETY  Other orders -     PARoxetine (PAXIL) 40 MG tablet; TAKE 1 TABLET (40 MG TOTAL) BY MOUTH EVERY MORNING. -  Discontinue: clonazePAM (KLONOPIN) 1 MG tablet; Take 1 tablet (1 mg total) 2 (two) times daily by mouth. -     clonazePAM (KLONOPIN) 1 MG tablet; Take 1 tablet (1 mg total) 3 (three) times daily by mouth.    The patient I had a frank discussion regarding increasing her dose of medication of the controlled substance clonazepam.  I went ahead and authorize the increase to 3 times a day currently.  However I advised her against increasing it further in the future for safety reasons.  Specifically I would not authorize it should she increase it again without discussion in advance.      I have discontinued Sandra Deleon's clonazePAM and buPROPion. I have also changed her clonazePAM. Additionally, I am having her maintain her multivitamin, albuterol, albuterol, montelukast, pantoprazole, and PARoxetine.  Allergies as of 01/23/2017      Reactions   Codeine    rash   Pseudoephedrine    Heart beating through body.      Medication List        Accurate as of 01/23/17 12:10 PM. Always use your most recent med list.          albuterol (2.5 MG/3ML) 0.083% nebulizer solution Commonly known as:  PROVENTIL Take 3 mLs (2.5 mg total) by nebulization every 6 (six) hours as needed for wheezing or shortness of breath.   albuterol 108 (90 Base) MCG/ACT inhaler Commonly known as:  PROVENTIL HFA;VENTOLIN HFA Inhale 1-2 puffs into the lungs every 6 (six) hours as needed for wheezing.   clonazePAM 1 MG tablet Commonly known as:  KLONOPIN Take 1 tablet (1 mg total) 3 (three) times daily by mouth.   montelukast 10 MG tablet Commonly known as:  SINGULAIR Take 1 tablet (10 mg total) by mouth at bedtime.   multivitamin tablet Take 1 tablet by mouth daily.   pantoprazole 40 MG tablet Commonly known as:  PROTONIX Take 1 tablet (40 mg total) by mouth daily.   PARoxetine 40 MG tablet Commonly known as:  PAXIL TAKE 1 TABLET (40 MG TOTAL) BY MOUTH EVERY MORNING.        Follow-up: Return in about 4 months (around 05/23/2017) for Anxiety, depression, asthma, reflux.  Mechele ClaudeWarren Mirela Parsley, M.D.

## 2017-04-04 ENCOUNTER — Ambulatory Visit: Payer: BLUE CROSS/BLUE SHIELD | Admitting: Family Medicine

## 2017-06-04 ENCOUNTER — Ambulatory Visit: Payer: BLUE CROSS/BLUE SHIELD | Admitting: Family Medicine

## 2017-06-04 ENCOUNTER — Encounter: Payer: Self-pay | Admitting: Family Medicine

## 2017-06-04 VITALS — BP 95/58 | HR 65 | Temp 97.4°F | Ht 64.0 in | Wt 136.4 lb

## 2017-06-04 DIAGNOSIS — J029 Acute pharyngitis, unspecified: Secondary | ICD-10-CM

## 2017-06-04 LAB — RAPID STREP SCREEN (MED CTR MEBANE ONLY): Strep Gp A Ag, IA W/Reflex: NEGATIVE

## 2017-06-04 LAB — CULTURE, GROUP A STREP

## 2017-06-04 NOTE — Progress Notes (Signed)
Subjective: CC:? strep PCP: Mechele ClaudeStacks, Warren, MD NWG:NFAOZHYHPI:Sandra Deleon is a 57 y.o. female presenting to clinic today for:  1. Strep Patient reports onset of sore throat, headache and bilateral ear pain 2 days ago.  She notes sinus drainage is clear.  Denies any cough, congestion, fevers, chills.  No vomiting but did have intermittent nausea.  This is actually her baseline to have intermittent nausea, as she does have a hiatal hernia.  She notes multiple sick grandkids with strep throat.  She wanted to come here to make sure that she did not have this.  She has been using saline nasal spray and Singulair for symptoms.   ROS: Per HPI  Allergies  Allergen Reactions  . Bupropion Anxiety  . Codeine     rash  . Pseudoephedrine     Heart beating through body.   Past Medical History:  Diagnosis Date  . Anxiety   . Asthma   . GERD (gastroesophageal reflux disease)   . Hiatal hernia     Current Outpatient Medications:  .  albuterol (PROVENTIL HFA;VENTOLIN HFA) 108 (90 Base) MCG/ACT inhaler, Inhale 1-2 puffs into the lungs every 6 (six) hours as needed for wheezing., Disp: 1 Inhaler, Rfl: 6 .  albuterol (PROVENTIL) (2.5 MG/3ML) 0.083% nebulizer solution, Take 3 mLs (2.5 mg total) by nebulization every 6 (six) hours as needed for wheezing or shortness of breath., Disp: 150 mL, Rfl: 5 .  clonazePAM (KLONOPIN) 1 MG tablet, Take 1 tablet (1 mg total) 3 (three) times daily by mouth., Disp: 90 tablet, Rfl: 3 .  montelukast (SINGULAIR) 10 MG tablet, Take 1 tablet (10 mg total) by mouth at bedtime., Disp: 90 tablet, Rfl: 1 .  Multiple Vitamin (MULTIVITAMIN) tablet, Take 1 tablet by mouth daily., Disp: , Rfl:  .  pantoprazole (PROTONIX) 40 MG tablet, Take 1 tablet (40 mg total) by mouth daily., Disp: 90 tablet, Rfl: 1 .  PARoxetine (PAXIL) 40 MG tablet, TAKE 1 TABLET (40 MG TOTAL) BY MOUTH EVERY MORNING., Disp: 90 tablet, Rfl: 0 Social History   Socioeconomic History  . Marital status: Married      Spouse name: Not on file  . Number of children: Not on file  . Years of education: Not on file  . Highest education level: Not on file  Occupational History  . Not on file  Social Needs  . Financial resource strain: Not on file  . Food insecurity:    Worry: Not on file    Inability: Not on file  . Transportation needs:    Medical: Not on file    Non-medical: Not on file  Tobacco Use  . Smoking status: Never Smoker  . Smokeless tobacco: Never Used  Substance and Sexual Activity  . Alcohol use: No  . Drug use: No  . Sexual activity: Yes  Lifestyle  . Physical activity:    Days per week: Not on file    Minutes per session: Not on file  . Stress: Not on file  Relationships  . Social connections:    Talks on phone: Not on file    Gets together: Not on file    Attends religious service: Not on file    Active member of club or organization: Not on file    Attends meetings of clubs or organizations: Not on file    Relationship status: Not on file  . Intimate partner violence:    Fear of current or ex partner: Not on file    Emotionally  abused: Not on file    Physically abused: Not on file    Forced sexual activity: Not on file  Other Topics Concern  . Not on file  Social History Narrative  . Not on file   Family History  Problem Relation Age of Onset  . Heart disease Father        Afib  . Hypertension Brother   . Heart disease Sister     Objective: Office vital signs reviewed. BP (!) 95/58   Pulse 65   Temp (!) 97.4 F (36.3 C) (Oral)   Ht 5\' 4"  (1.626 m)   Wt 136 lb 6.4 oz (61.9 kg)   BMI 23.41 kg/m   Physical Examination:  General: Awake, alert, well nourished, well appearing female, No acute distress HEENT: Normal    Neck: No masses palpated. No lymphadenopathy    Ears: Tympanic membranes intact, normal light reflex, no erythema, no bulging    Eyes: PERRLA, extraocular membranes intact, sclera white    Nose: nasal turbinates moist, clear nasal  discharge    Throat: moist mucus membranes, no erythema, no tonsillar exudate.  Airway is patent Cardio: regular rate and rhythm, S1S2 heard, no murmurs appreciated Pulm: clear to auscultation bilaterally, no wheezes, rhonchi or rales; normal work of breathing on room air  Assessment/ Plan: 57 y.o. female   1. Sore throat Patient is afebrile and nontoxic-appearing.  Her rapid strep was negative.  Likely viral URI versus allergy mediated sore throat.  May continue oral antihistamine and saline spray.  Supportive care measures recommended.  Home care instructions were reviewed and handout was provided. Strict return precautions and reasons for emergent evaluation in the emergency department review with patient.  They voiced understanding and will follow-up as needed. - Rapid Strep Screen (Not at [facility])   Orders Placed This Encounter  Procedures  . Rapid Strep Screen (Not at [facility])      Raliegh Ip, DO [facility] [phone]

## 2017-06-04 NOTE — Patient Instructions (Signed)
Your strep test was negative.  As we discussed, you had a little bit of fluid on the left side in the ear but this does not look infected.  I think this is likely a viral process.  It appears that you have a viral upper respiratory infection (cold).  Cold symptoms can last up to 2 weeks.    - Get plenty of rest and drink plenty of fluids. - Try to breathe moist air. Use a cold mist humidifier. - Consume warm fluids (soup or tea) to provide relief for a stuffy nose and to loosen phlegm. - For nasal stuffiness, try saline nasal spray or a Neti Pot. Afrin nasal spray can also be used but this product should not be used longer than 3 days or it will cause rebound nasal stuffiness (worsening nasal congestion). - For sore throat pain relief: suck on throat lozenges, hard candy or popsicles; gargle with warm salt water (1/4 tsp. salt per 8 oz. of water); and eat soft, bland foods. - Eat a well-balanced diet. If you cannot, ensure you are getting enough nutrients by taking a daily multivitamin. - Avoid dairy products, as they can thicken phlegm. - Avoid alcohol, as it impairs your body's immune system.  CONTACT YOUR DOCTOR IF YOU EXPERIENCE ANY OF THE FOLLOWING: - High fever - Ear pain - Sinus-type headache - Unusually severe cold symptoms - Cough that gets worse while other cold symptoms improve - Flare up of any chronic lung problem, such as asthma - Your symptoms persist longer than 2 weeks

## 2017-06-27 ENCOUNTER — Ambulatory Visit: Payer: BLUE CROSS/BLUE SHIELD | Admitting: Family Medicine

## 2017-06-27 VITALS — Ht 64.0 in | Wt 134.2 lb

## 2017-06-27 DIAGNOSIS — J45909 Unspecified asthma, uncomplicated: Secondary | ICD-10-CM

## 2017-06-27 DIAGNOSIS — K2100 Gastro-esophageal reflux disease with esophagitis, without bleeding: Secondary | ICD-10-CM

## 2017-06-27 DIAGNOSIS — J302 Other seasonal allergic rhinitis: Secondary | ICD-10-CM | POA: Diagnosis not present

## 2017-06-27 MED ORDER — PANTOPRAZOLE SODIUM 40 MG PO TBEC: 40.0000 mg | DELAYED_RELEASE_TABLET | Freq: Every day | ORAL | 1 refills | Status: DC

## 2017-06-27 MED ORDER — PAROXETINE HCL 40 MG PO TABS
ORAL_TABLET | ORAL | 1 refills | Status: DC
Start: 2017-06-27 — End: 2017-12-26

## 2017-06-27 MED ORDER — MONTELUKAST SODIUM 10 MG PO TABS: 10.0000 mg | ORAL_TABLET | Freq: Every day | ORAL | 1 refills | Status: DC

## 2017-06-27 MED ORDER — FLUTICASONE FUROATE-VILANTEROL 100-25 MCG/INH IN AEPB: 1.0000 | INHALATION_SPRAY | Freq: Every day | RESPIRATORY_TRACT | 5 refills | Status: DC

## 2017-06-27 MED ORDER — RISPERIDONE 1 MG PO TABS: 1.0000 mg | ORAL_TABLET | Freq: Every day | ORAL | 5 refills | Status: DC

## 2017-06-28 ENCOUNTER — Other Ambulatory Visit: Payer: Self-pay | Admitting: Family Medicine

## 2017-06-28 LAB — CMP14+EGFR
A/G RATIO: 2.5 — AB (ref 1.2–2.2)
ALK PHOS: 58 IU/L (ref 39–117)
ALT: 13 IU/L (ref 0–32)
AST: 22 IU/L (ref 0–40)
Albumin: 4.3 g/dL (ref 3.5–5.5)
BILIRUBIN TOTAL: 0.4 mg/dL (ref 0.0–1.2)
BUN/Creatinine Ratio: 22 (ref 9–23)
BUN: 18 mg/dL (ref 6–24)
CHLORIDE: 103 mmol/L (ref 96–106)
CO2: 24 mmol/L (ref 20–29)
Calcium: 9.3 mg/dL (ref 8.7–10.2)
Creatinine, Ser: 0.81 mg/dL (ref 0.57–1.00)
GFR calc Af Amer: 94 mL/min/{1.73_m2} (ref >59)
GFR calc non Af Amer: 81 mL/min/{1.73_m2} (ref >59)
GLOBULIN, TOTAL: 1.7 g/dL (ref 1.5–4.5)
Glucose: 77 mg/dL (ref 65–99)
POTASSIUM: 3.8 mmol/L (ref 3.5–5.2)
SODIUM: 144 mmol/L (ref 134–144)
Total Protein: 6 g/dL (ref 6.0–8.5)

## 2017-06-28 LAB — CBC WITH DIFFERENTIAL/PLATELET
BASOS: 1 %
Basophils Absolute: 0 10*3/uL (ref 0.0–0.2)
EOS (ABSOLUTE): 0.2 10*3/uL (ref 0.0–0.4)
EOS: 3 %
HEMATOCRIT: 38.7 % (ref 34.0–46.6)
Hemoglobin: 12.7 g/dL (ref 11.1–15.9)
Immature Grans (Abs): 0 10*3/uL (ref 0.0–0.1)
Immature Granulocytes: 0 %
LYMPHS ABS: 2.1 10*3/uL (ref 0.7–3.1)
Lymphs: 35 %
MCH: 29.7 pg (ref 26.6–33.0)
MCHC: 32.8 g/dL (ref 31.5–35.7)
MCV: 90 fL (ref 79–97)
MONOS ABS: 0.5 10*3/uL (ref 0.1–0.9)
Monocytes: 8 %
Neutrophils Absolute: 3.2 10*3/uL (ref 1.4–7.0)
Neutrophils: 53 %
PLATELETS: 241 10*3/uL (ref 150–379)
RBC: 4.28 x10E6/uL (ref 3.77–5.28)
RDW: 13.1 % (ref 12.3–15.4)
WBC: 5.9 10*3/uL (ref 3.4–10.8)

## 2017-07-01 ENCOUNTER — Encounter: Payer: Self-pay | Admitting: Family Medicine

## 2017-07-01 MED ORDER — CLONAZEPAM 1 MG PO TABS: 1.0000 mg | ORAL_TABLET | Freq: Three times a day (TID) | ORAL | 5 refills | Status: DC

## 2017-07-01 NOTE — Progress Notes (Signed)
Subjective:  Patient ID: Sandra Deleon, female    DOB: 04/30/1960  Age: 57 y.o. MRN: [id number]  CC: No chief complaint on file.   HPI LEILY CAPEK presents for patient in for follow-up of GERD. Currently asymptomatic taking  PPI daily. There is no chest pain or heartburn. No hematemesis and no melena. No dysphagia or choking. Onset is remote. Progression is stable. Complicating factors, none. Patient also having quite a bit of wheezing.  Shortness of breath.   also coughing.  Nonproductive.  No fever chills or sweats noted.  Increase over the last several days to weeks.  Patient has chronic asthma.  It has flared since allergy season has started particularly with all the pollen in the air.  She needs to go back on her Singulair.  She also is taking clonazepam for her anxiety.  This is been under good control as long she takes medicine denies excessive nervousness no physical symptoms it can flare if she misses doses.  Depression screen [facility] 2/9 06/27/2017 06/04/2017 01/23/2017  Decreased Interest 2 0 0  Down, Depressed, Hopeless 2 0 0  PHQ - 2 Score 4 0 0  Altered sleeping 0 - -  Tired, decreased energy 0 - -  Change in appetite 0 - -  Feeling bad or failure about yourself  0 - -  Trouble concentrating 0 - -  Moving slowly or fidgety/restless 1 - -  Suicidal thoughts 0 - -  PHQ-9 Score 5 - -    History Eiliana has a past medical history of Anxiety, Asthma, GERD (gastroesophageal reflux disease), and Hiatal hernia.   She has a past surgical history that includes Nose surgery; Esophagogastroduodenoscopy (04/09/2008); Esophagogastroduodenoscopy (04/25/2005); Partial hysterectomy; and Vagina reconstruction surgery.   Her family history includes Heart disease in her father and sister; Hypertension in her brother.She reports that she has never smoked. She has never used smokeless tobacco. She reports that she does not drink alcohol or use drugs.    ROS Review of Systems    Constitutional: Negative.  Negative for activity change and appetite change.  HENT: Negative for congestion.   Eyes: Negative for visual disturbance.  Respiratory: Negative for shortness of breath.   Cardiovascular: Negative for chest pain.  Gastrointestinal: Negative for abdominal pain, constipation, diarrhea, nausea and vomiting.  Genitourinary: Negative for difficulty urinating.  Musculoskeletal: Negative for arthralgias and myalgias.  Neurological: Negative for headaches.  Psychiatric/Behavioral: Negative for sleep disturbance.    Objective:  Ht _0  (1.626 m)   Wt 134 lb 4 oz (60.9 kg)   BMI 23.04 kg/m   BP Readings from Last 3 Encounters:  06/04/17 (!) 95/58  01/23/17 93/60  01/02/17 (!) 87/56    Wt Readings from Last 3 Encounters:  06/27/17 134 lb 4 oz (60.9 kg)  06/04/17 136 lb 6.4 oz (61.9 kg)  01/23/17 135 lb 12.8 oz (61.6 kg)     Physical Exam  Constitutional: She is oriented to person, place, and time. She appears well-developed and well-nourished. No distress.  HENT:  Head: Normocephalic and atraumatic.  Right Ear: External ear normal.  Left Ear: External ear normal.  Nose: Nose normal.  Mouth/Throat: Oropharynx is clear and moist. No oropharyngeal exudate.  Eyes: Pupils are equal, round, and reactive to light. Conjunctivae and EOM are normal. Right eye exhibits no discharge. Left eye exhibits no discharge. No scleral icterus.  Neck: Normal range of motion. Neck supple. No JVD present. No thyromegaly present.  Cardiovascular: Normal rate, regular rhythm,  normal heart sounds and intact distal pulses. Exam reveals no gallop and no friction rub.  No murmur heard. Pulmonary/Chest: Effort normal. No stridor. No respiratory distress. She has wheezes. She has no rales. She exhibits no tenderness.  Abdominal: Soft. Bowel sounds are normal. There is no tenderness.  Musculoskeletal: Normal range of motion.  Lymphadenopathy:    She has no cervical adenopathy.   Neurological: She is alert and oriented to person, place, and time. She displays normal reflexes. No cranial nerve deficit. She exhibits normal muscle tone. Coordination normal.  Skin: Skin is warm and dry. She is not diaphoretic.  Psychiatric: She has a normal mood and affect. Her behavior is normal.  Vitals reviewed.     Assessment & Plan:   Diagnoses and all orders for this visit:  Uncomplicated asthma, unspecified asthma severity, unspecified whether persistent  Seasonal allergies -     CBC with Differential/Platelet -     CMP14+EGFR -     montelukast (SINGULAIR) 10 MG tablet; Take 1 tablet (10 mg total) by mouth at bedtime.  Gastroesophageal reflux disease with esophagitis  Other orders -     pantoprazole (PROTONIX) 40 MG tablet; Take 1 tablet (40 mg total) by mouth daily. -     PARoxetine (PAXIL) 40 MG tablet; TAKE 1 TABLET (40 MG TOTAL) BY MOUTH EVERY MORNING. -     risperiDONE (RISPERDAL) 1 MG tablet; Take 1 tablet (1 mg total) by mouth at bedtime. -     fluticasone furoate-vilanterol (BREO ELLIPTA) 100-25 MCG/INH AEPB; Inhale 1 puff into the lungs daily. -     clonazePAM (KLONOPIN) 1 MG tablet; Take 1 tablet (1 mg total) by mouth 3 (three) times daily.       I have changed Rayli R. [place]'s clonazePAM. I am also having her start on risperiDONE and fluticasone furoate-vilanterol. Additionally, I am having her maintain her multivitamin, albuterol, albuterol, montelukast, pantoprazole, and PARoxetine.  Allergies as of 06/27/2017      Reactions   Bupropion Anxiety   Codeine    rash   Pseudoephedrine    Heart beating through body.      Medication List        Accurate as of 06/27/17 11:59 PM. Always use your most recent med list.          albuterol (2.5 MG/3ML) 0.083% nebulizer solution Commonly known as:  PROVENTIL Take 3 mLs (2.5 mg total) by nebulization every 6 (six) hours as needed for wheezing or shortness of breath.   albuterol 108 (90 Base)  MCG/ACT inhaler Commonly known as:  PROVENTIL HFA;VENTOLIN HFA Inhale 1-2 puffs into the lungs every 6 (six) hours as needed for wheezing.   clonazePAM 1 MG tablet Commonly known as:  KLONOPIN Take 1 tablet (1 mg total) by mouth 3 (three) times daily.   fluticasone furoate-vilanterol 100-25 MCG/INH Aepb Commonly known as:  BREO ELLIPTA Inhale 1 puff into the lungs daily.   montelukast 10 MG tablet Commonly known as:  SINGULAIR Take 1 tablet (10 mg total) by mouth at bedtime.   multivitamin tablet Take 1 tablet by mouth daily.   pantoprazole 40 MG tablet Commonly known as:  PROTONIX Take 1 tablet (40 mg total) by mouth daily.   PARoxetine 40 MG tablet Commonly known as:  PAXIL TAKE 1 TABLET (40 MG TOTAL) BY MOUTH EVERY MORNING.   risperiDONE 1 MG tablet Commonly known as:  RISPERDAL Take 1 tablet (1 mg total) by mouth at bedtime.  Follow-up: Return in about 3 months (around 09/26/2017).  Claretta Fraise, M.D.

## 2017-07-19 ENCOUNTER — Other Ambulatory Visit: Payer: Self-pay | Admitting: Family Medicine

## 2017-11-30 ENCOUNTER — Encounter: Payer: Self-pay | Admitting: Family Medicine

## 2017-11-30 ENCOUNTER — Ambulatory Visit: Payer: BLUE CROSS/BLUE SHIELD | Admitting: Family Medicine

## 2017-11-30 VITALS — BP 105/56 | HR 66 | Temp 97.6°F | Ht 64.0 in | Wt 134.5 lb

## 2017-11-30 DIAGNOSIS — R200 Anesthesia of skin: Secondary | ICD-10-CM

## 2017-11-30 DIAGNOSIS — R531 Weakness: Secondary | ICD-10-CM | POA: Diagnosis not present

## 2017-11-30 NOTE — Progress Notes (Addendum)
Subjective:  Patient ID: Sandra FermoCynthia R Aman, female    DOB: 04/30/1960  Age: 57 y.o. MRN: [id number]  CC: Numbness (pt here today c/o increased numbness in feet/legs and other parts of her body)   HPI Sandra Deleon presents for numbness and weakness intermittently. Pt. Has episodes of weakness in the legs when she climbs a single flight of stairs. The numbness affects her face and her extremities. Sometimes the numbness manifests as tingling. She will get twinges through her head like an electrical shock. Her skin at times feels like it is twitching and moving.  Pt. Has a history of fibromyalgia and family history of MS.  [facility] 11/30/2017 06/27/2017 06/04/2017  Decreased Interest 0 2 0  Down, Depressed, Hopeless 0 2 0  PHQ - 2 Score 0 4 0  Altered sleeping - 0 -  Tired, decreased energy - 0 -  Change in appetite - 0 -  Feeling bad or failure about yourself  - 0 -  Trouble concentrating - 0 -  Moving slowly or fidgety/restless - 1 -  Suicidal thoughts - 0 -  PHQ-9 Score - 5 -    History Sandra Deleon has a past medical history of Anxiety, Asthma, GERD (gastroesophageal reflux disease), and Hiatal hernia.   She has a past surgical history that includes Nose surgery; Esophagogastroduodenoscopy (04/09/2008); Esophagogastroduodenoscopy (04/25/2005); Partial hysterectomy; and Vagina reconstruction surgery.   Her family history includes Heart disease in her father and sister; Hypertension in her brother.She reports that she has never smoked. She has never used smokeless tobacco. She reports that she does not drink alcohol or use drugs.    ROS Review of Systems  Constitutional: Negative.   HENT: Negative.   Eyes: Negative for visual disturbance.  Respiratory: Negative for shortness of breath.   Cardiovascular: Negative for chest pain.  Gastrointestinal: Negative for abdominal pain.  Musculoskeletal: Negative for arthralgias.  Neurological: Positive for numbness.     Objective:  BP (!) 105/56   Pulse 66   Temp 97.6 F (36.4 C) (Oral)   Ht 5\' 4"  (1.626 m)   Wt 134 lb 8 oz (61 kg)   BMI 23.09 kg/m   BP Readings from Last 3 Encounters:  11/30/17 (!) 105/56  06/04/17 (!) 95/58  01/23/17 93/60    Wt Readings from Last 3 Encounters:  11/30/17 134 lb 8 oz (61 kg)  06/27/17 134 lb 4 oz (60.9 kg)  06/04/17 136 lb 6.4 oz (61.9 kg)     Physical Exam  Constitutional: She is oriented to person, place, and time. She appears well-developed and well-nourished. No distress.  HENT:  Head: Normocephalic and atraumatic.  Eyes: Pupils are equal, round, and reactive to light. Conjunctivae are normal.  Neck: Normal range of motion. Neck supple. No thyromegaly present.  Cardiovascular: Normal rate, regular rhythm and normal heart sounds.  No murmur heard. Pulmonary/Chest: Effort normal and breath sounds normal. No respiratory distress. She has no wheezes. She has no rales.  Abdominal: Soft. Bowel sounds are normal. She exhibits no distension. There is no tenderness.  Musculoskeletal: Normal range of motion.  Lymphadenopathy:    She has no cervical adenopathy.  Neurological: She is alert and oriented to person, place, and time.  Skin: Skin is warm and dry.  Psychiatric: She has a normal mood and affect. Her behavior is normal. Judgment and thought content normal.      Assessment & Plan:   Sandra Deleon was seen today for numbness.  Diagnoses and all  orders for this visit:  Progressive focal motor weakness -     MR Brain W Wo Contrast; Future -     Ambulatory referral to Neurology  Numbness -     MR Brain W Wo Contrast; Future -     Ambulatory referral to Neurology       I have discontinued Sandra Deleon's risperiDONE. I am also having her maintain her multivitamin, albuterol, albuterol, montelukast, pantoprazole, PARoxetine, fluticasone furoate-vilanterol, and clonazePAM.  Allergies as of 11/30/2017      Reactions   Bupropion Anxiety    Chocolate Flavor    Codeine    rash   Pseudoephedrine    Heart beating through body.      Medication List        Accurate as of 11/30/17 11:59 PM. Always use your most recent med list.          albuterol (2.5 MG/3ML) 0.083% nebulizer solution Commonly known as:  PROVENTIL Take 3 mLs (2.5 mg total) by nebulization every 6 (six) hours as needed for wheezing or shortness of breath.   albuterol 108 (90 Base) MCG/ACT inhaler Commonly known as:  PROVENTIL HFA;VENTOLIN HFA Inhale 1-2 puffs into the lungs every 6 (six) hours as needed for wheezing.   clonazePAM 1 MG tablet Commonly known as:  KLONOPIN TAKE 1 TABLET BY MOUTH THREE TIMES DAILY   fluticasone furoate-vilanterol 100-25 MCG/INH Aepb Commonly known as:  BREO ELLIPTA Inhale 1 puff into the lungs daily.   montelukast 10 MG tablet Commonly known as:  SINGULAIR Take 1 tablet (10 mg total) by mouth at bedtime.   multivitamin tablet Take 1 tablet by mouth daily.   pantoprazole 40 MG tablet Commonly known as:  PROTONIX Take 1 tablet (40 mg total) by mouth daily.   PARoxetine 40 MG tablet Commonly known as:  PAXIL TAKE 1 TABLET (40 MG TOTAL) BY MOUTH EVERY MORNING.        Follow-up: Return in about 1 month (around 12/30/2017).  Mechele Claude, M.D.

## 2017-12-02 ENCOUNTER — Encounter: Payer: Self-pay | Admitting: Family Medicine

## 2017-12-03 ENCOUNTER — Encounter: Payer: Self-pay | Admitting: Neurology

## 2017-12-10 ENCOUNTER — Ambulatory Visit (HOSPITAL_COMMUNITY)
Admission: RE | Admit: 2017-12-10 | Discharge: 2017-12-10 | Disposition: A | Discharge status: Home / Self Care | Payer: BLUE CROSS/BLUE SHIELD | Source: Ambulatory Visit | Attending: Family Medicine | Admitting: Family Medicine

## 2017-12-10 DIAGNOSIS — R200 Anesthesia of skin: Secondary | ICD-10-CM | POA: Insufficient documentation

## 2017-12-10 DIAGNOSIS — R531 Weakness: Secondary | ICD-10-CM

## 2017-12-10 MED ORDER — GADOBUTROL 1 MMOL/ML IV SOLN
6.0000 mL | Freq: Once | INTRAVENOUS | Status: AC | PRN
  Administered 2017-12-10: 7.5 mL via INTRAVENOUS

## 2017-12-26 ENCOUNTER — Encounter: Payer: Self-pay | Admitting: Family Medicine

## 2017-12-26 ENCOUNTER — Telehealth: Payer: Self-pay | Admitting: Family Medicine

## 2017-12-26 ENCOUNTER — Ambulatory Visit: Payer: BLUE CROSS/BLUE SHIELD | Admitting: Family Medicine

## 2017-12-26 VITALS — BP 102/63 | HR 69 | Temp 97.6°F | Ht 64.0 in | Wt 138.0 lb

## 2017-12-26 DIAGNOSIS — K2100 Gastro-esophageal reflux disease with esophagitis, without bleeding: Secondary | ICD-10-CM

## 2017-12-26 DIAGNOSIS — J302 Other seasonal allergic rhinitis: Secondary | ICD-10-CM

## 2017-12-26 DIAGNOSIS — F341 Dysthymic disorder: Secondary | ICD-10-CM | POA: Diagnosis not present

## 2017-12-26 LAB — URINALYSIS
Bilirubin, UA: NEGATIVE
Glucose, UA: NEGATIVE
KETONES UA: NEGATIVE
Leukocytes, UA: NEGATIVE
Nitrite, UA: NEGATIVE
PH UA: 5.5 (ref 5.0–7.5)
PROTEIN UA: NEGATIVE
RBC UA: NEGATIVE
Specific Gravity, UA: 1.015 (ref 1.005–1.030)
Urobilinogen, Ur: 0.2 mg/dL (ref 0.2–1.0)

## 2017-12-26 MED ORDER — MONTELUKAST SODIUM 10 MG PO TABS: 10.0000 mg | ORAL_TABLET | Freq: Every day | ORAL | 1 refills | Status: AC

## 2017-12-26 MED ORDER — PAROXETINE HCL 40 MG PO TABS: ORAL_TABLET | ORAL | 1 refills | Status: DC

## 2017-12-26 MED ORDER — ALBUTEROL SULFATE HFA 108 (90 BASE) MCG/ACT IN AERS: 1.0000 | INHALATION_SPRAY | Freq: Four times a day (QID) | RESPIRATORY_TRACT | 6 refills | Status: AC | PRN

## 2017-12-26 MED ORDER — CLONAZEPAM 1 MG PO TABS: 1.0000 mg | ORAL_TABLET | Freq: Three times a day (TID) | ORAL | 5 refills | Status: DC

## 2017-12-26 MED ORDER — PANTOPRAZOLE SODIUM 40 MG PO TBEC: 40.0000 mg | DELAYED_RELEASE_TABLET | Freq: Every day | ORAL | 1 refills | Status: DC

## 2017-12-26 MED ORDER — FLUTICASONE FUROATE-VILANTEROL 100-25 MCG/INH IN AEPB: 1.0000 | INHALATION_SPRAY | Freq: Every day | RESPIRATORY_TRACT | 5 refills | Status: DC

## 2017-12-26 MED ORDER — ALBUTEROL SULFATE (2.5 MG/3ML) 0.083% IN NEBU: 2.5000 mg | INHALATION_SOLUTION | Freq: Four times a day (QID) | RESPIRATORY_TRACT | 5 refills | Status: AC | PRN

## 2017-12-26 NOTE — Patient Instructions (Signed)
Fexofenadine can help with allergies.

## 2017-12-26 NOTE — Progress Notes (Signed)
Subjective:  Patient ID: Sandra Deleon, female    DOB: 03-22-1960  Age: 57 y.o. MRN: [id number]  CC: Medical Management of Chronic Issues   HPI [name] presents for patient in for follow-up of GERD. Currently asymptomatic taking  PPI daily. There is no chest pain or heartburn. No hematemesis and no melena. No dysphagia or choking. Onset is remote. Progression is stable. Complicating factors, none.  Patient continues to use medication for anxiety successfully symptoms are under good control for depression as well.  Needs refills of her medications Paxil and clonazepam.  Additionally she has history of asthma and COPD.  She denies dyspnea currently.  Pt. 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 Awaiting referral to neuro.No sign of MS on scan. Still having paresthesias.   Depression screen [facility] 2/9 12/26/2017 11/30/2017 06/27/2017  Decreased Interest 0 0 2  Down, Depressed, Hopeless 0 0 2  PHQ - 2 Score 0 0 4  Altered sleeping - - 0  Tired, decreased energy - - 0  Change in appetite - - 0  Feeling bad or failure about yourself  - - 0  Trouble concentrating - - 0  Moving slowly or fidgety/restless - - 1  Suicidal thoughts - - 0  PHQ-9 Score - - 5    History Sandra Deleon has a past medical history of Anxiety, Asthma, GERD (gastroesophageal reflux disease), and Hiatal hernia.   She has a past surgical history that includes Nose surgery; Esophagogastroduodenoscopy (04/09/2008); Esophagogastroduodenoscopy (04/25/2005); Partial hysterectomy; and Vagina reconstruction surgery.   Her family history includes Heart disease in her father and sister; Hypertension in her brother.She reports that she has never smoked. She has never used smokeless tobacco. She reports that she does not drink alcohol or use drugs.    ROS Review of Systems  Constitutional: Negative.   HENT: Negative for congestion.   Eyes: Negative for visual disturbance.  Respiratory: Negative for shortness of breath.     Cardiovascular: Negative for chest pain.  Gastrointestinal: Negative for abdominal pain, constipation, diarrhea, nausea and vomiting.  Genitourinary: Negative for difficulty urinating.  Musculoskeletal: Negative for arthralgias and myalgias.  Neurological: Negative for headaches.  Psychiatric/Behavioral: Negative for sleep disturbance.    Objective:  BP 102/63   Pulse 69   Temp 97.6 F (36.4 C) (Oral)   Ht 5\' 4"  (1.626 m)   Wt 138 lb (62.6 kg)   BMI 23.69 kg/m   BP Readings from Last 3 Encounters:  12/26/17 102/63  11/30/17 (!) 105/56  06/04/17 (!) 95/58    Wt Readings from Last 3 Encounters:  12/26/17 138 lb (62.6 kg)  11/30/17 134 lb 8 oz (61 kg)  06/27/17 134 lb 4 oz (60.9 kg)     Physical Exam  Constitutional: She is oriented to person, place, and time. She appears well-developed and well-nourished. No distress.  HENT:  Head: Normocephalic and atraumatic.  Right Ear: External ear normal.  Left Ear: External ear normal.  Nose: Nose normal.  Mouth/Throat: Oropharynx is clear and moist.  Eyes: Pupils are equal, round, and reactive to light. Conjunctivae and EOM are normal.  Neck: Normal range of motion. Neck supple. No thyromegaly present.  Cardiovascular: Normal rate, regular rhythm and normal heart sounds.  No murmur heard. Pulmonary/Chest: Effort normal and breath sounds normal. No respiratory distress. She has no wheezes. She has no rales.  Abdominal: Soft. Bowel sounds are normal. She exhibits no distension. There is no tenderness.  Lymphadenopathy:    She has no cervical adenopathy.  Neurological: She is alert and oriented to person, place, and time. She has normal reflexes.  Skin: Skin is warm and dry.  Psychiatric: She has a normal mood and affect. Her behavior is normal. Judgment and thought content normal.      Assessment & Plan:   Aevah was seen today for medical management of chronic issues.  Diagnoses and all orders for this  visit:  DEPRESSION/ANXIETY -     Urinalysis -     Urine Culture -     ToxASSURE Select 13 (MW), Urine  Seasonal allergies -     montelukast (SINGULAIR) 10 MG tablet; Take 1 tablet (10 mg total) by mouth at bedtime.  Gastroesophageal reflux disease with esophagitis  Other orders -     albuterol (PROVENTIL HFA;VENTOLIN HFA) 108 (90 Base) MCG/ACT inhaler; Inhale 1-2 puffs into the lungs every 6 (six) hours as needed for wheezing. -     albuterol (PROVENTIL) (2.5 MG/3ML) 0.083% nebulizer solution; Take 3 mLs (2.5 mg total) by nebulization every 6 (six) hours as needed for wheezing or shortness of breath. -     clonazePAM (KLONOPIN) 1 MG tablet; Take 1 tablet (1 mg total) by mouth 3 (three) times daily. -     fluticasone furoate-vilanterol (BREO ELLIPTA) 100-25 MCG/INH AEPB; Inhale 1 puff into the lungs daily. -     pantoprazole (PROTONIX) 40 MG tablet; Take 1 tablet (40 mg total) by mouth daily. -     PARoxetine (PAXIL) 40 MG tablet; TAKE 1 TABLET (40 MG TOTAL) BY MOUTH EVERY MORNING.       I have changed Alease Frame. [place]'s clonazePAM. I am also having her maintain her multivitamin, albuterol, albuterol, fluticasone furoate-vilanterol, montelukast, pantoprazole, and PARoxetine.  Allergies as of 12/26/2017      Reactions   Bupropion Anxiety   Chocolate Flavor    Codeine    rash   Pseudoephedrine    Heart beating through body.      Medication List        Accurate as of 12/26/17 11:59 PM. Always use your most recent med list.          albuterol 108 (90 Base) MCG/ACT inhaler Commonly known as:  PROVENTIL HFA;VENTOLIN HFA Inhale 1-2 puffs into the lungs every 6 (six) hours as needed for wheezing.   albuterol (2.5 MG/3ML) 0.083% nebulizer solution Commonly known as:  PROVENTIL Take 3 mLs (2.5 mg total) by nebulization every 6 (six) hours as needed for wheezing or shortness of breath.   clonazePAM 1 MG tablet Commonly known as:  KLONOPIN Take 1 tablet (1 mg total) by  mouth 3 (three) times daily.   fluticasone furoate-vilanterol 100-25 MCG/INH Aepb Commonly known as:  BREO ELLIPTA Inhale 1 puff into the lungs daily.   montelukast 10 MG tablet Commonly known as:  SINGULAIR Take 1 tablet (10 mg total) by mouth at bedtime.   multivitamin tablet Take 1 tablet by mouth daily.   pantoprazole 40 MG tablet Commonly known as:  PROTONIX Take 1 tablet (40 mg total) by mouth daily.   PARoxetine 40 MG tablet Commonly known as:  PAXIL TAKE 1 TABLET (40 MG TOTAL) BY MOUTH EVERY MORNING.        Follow-up: Return in about 6 months (around 06/27/2018), or if symptoms worsen or fail to improve.  Mechele Claude, M.D.

## 2017-12-26 NOTE — Telephone Encounter (Signed)
Patient had spoken with Dr. Darlyn Read at her appointment today about her MRI of her brain and could not remember what he told her it was called.  I looked up the results of her MRI and gave her the title of "cavernous venous malformation/cavernoma" and told her if she had any further questions to give [place] a call tomorrow when Dr. Darlyn Read would be back in the office.

## 2017-12-27 LAB — URINE CULTURE

## 2017-12-30 ENCOUNTER — Encounter: Payer: Self-pay | Admitting: Family Medicine

## 2017-12-31 LAB — TOXASSURE SELECT 13 (MW), URINE

## 2018-01-28 ENCOUNTER — Ambulatory Visit: Payer: BLUE CROSS/BLUE SHIELD | Admitting: Neurology

## 2018-01-28 ENCOUNTER — Encounter: Payer: Self-pay | Admitting: Neurology

## 2018-01-28 ENCOUNTER — Other Ambulatory Visit (INDEPENDENT_AMBULATORY_CARE_PROVIDER_SITE_OTHER): Payer: BLUE CROSS/BLUE SHIELD

## 2018-01-28 VITALS — BP 122/74 | HR 61 | Ht 64.0 in | Wt 137.0 lb

## 2018-01-28 DIAGNOSIS — R202 Paresthesia of skin: Secondary | ICD-10-CM

## 2018-01-28 DIAGNOSIS — R200 Anesthesia of skin: Secondary | ICD-10-CM | POA: Diagnosis not present

## 2018-01-28 DIAGNOSIS — R6889 Other general symptoms and signs: Secondary | ICD-10-CM | POA: Diagnosis not present

## 2018-01-28 DIAGNOSIS — R531 Weakness: Secondary | ICD-10-CM

## 2018-01-28 LAB — VITAMIN B12: Vitamin B-12: 1006 pg/mL — ABNORMAL HIGH (ref 211–911)

## 2018-01-28 LAB — TSH: TSH: 1.59 u[IU]/mL (ref 0.35–4.50)

## 2018-01-28 NOTE — Patient Instructions (Addendum)
I don't appreciate any objective neurologic deficits.  The vascular anomaly in the brain is benign and most likely incidental (it does not explain your symptoms).  You do not have multiple sclerosis.  Your symptoms may be related to anxiety.  However, we will check B12 and TSH.  Your provider has requested that you have labwork completed today. Please go to [facility] ([id number]) on the second floor of this building before leaving the office today. You do not need to check in. If you are not called within 15 minutes please check with the front desk.

## 2018-01-28 NOTE — Progress Notes (Addendum)
NEUROLOGY CONSULTATION NOTE  KHAILEE MICK MRN: [id number] DOB: 1960/06/21  Referring provider: Mechele Claude, MD Primary care provider: Mechele Claude, MD  Reason for consult: Numbness and weakness  HISTORY OF PRESENT ILLNESS: Sandra Deleon is a 57 year old right-handed female with fibromyalgia and anxiety who presents for numbness and weakness.  History supplemented by referring providers note.  She is accompanied by a friend who supplements history.  She has had symptoms off and on for over 20 years. She reports multiple symptoms.  They occur daily but may fluctuate in intensity.  She reports that her left leg feels weak and numbness and would give out.  She reports numbness and tingling involving all extremities.  She reports sensation of itching on her skin.  She sometimes has short term memory issues.  She sometimes has difficulty getting words out or a completely different word will come out.  Sometimes she will experience brief jerks of an extremity or the head. She reports headaches and myalgias or sensation of muscle in leg tearing.  She has been checked for MS with MRI many years ago, which was unremarkable.  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  She has fibromyalgia.  She reports that her niece has multiple sclerosis and nephew has muscular dystrophy.    PAST MEDICAL HISTORY: Past Medical History:  Diagnosis Date  . Anxiety   . Asthma   . GERD (gastroesophageal reflux disease)   . Hiatal hernia     PAST SURGICAL HISTORY: Past Surgical History:  Procedure Laterality Date  . ESOPHAGOGASTRODUODENOSCOPY  04/09/2008   with [facility] dilation  . ESOPHAGOGASTRODUODENOSCOPY  04/25/2005  . NOSE SURGERY     blockage removed from right side of nose  . PARTIAL  HYSTERECTOMY     still has ovaries  . VAGINA RECONSTRUCTION SURGERY     past childbirth    MEDICATIONS: Current Outpatient Medications on File Prior to Visit  Medication Sig Dispense Refill  . albuterol (PROVENTIL HFA;VENTOLIN HFA) 108 (90 Base) MCG/ACT inhaler Inhale 1-2 puffs into the lungs every 6 (six) hours as needed for wheezing. 1 Inhaler 6  . albuterol (PROVENTIL) (2.5 MG/3ML) 0.083% nebulizer solution Take 3 mLs (2.5 mg total) by nebulization every 6 (six) hours as needed for wheezing or shortness of breath. 150 mL 5  . clonazePAM (KLONOPIN) 1 MG tablet Take 1 tablet (1 mg total) by mouth 3 (three) times daily. 90 tablet 5  . fluticasone furoate-vilanterol (BREO ELLIPTA) 100-25 MCG/INH AEPB Inhale 1 puff into the lungs daily. 28 each 5  . montelukast (SINGULAIR) 10 MG tablet Take 1 tablet (10 mg total) by mouth at bedtime. 90 tablet 1  . Multiple Vitamin (MULTIVITAMIN) tablet Take 1 tablet by mouth daily.    . pantoprazole (PROTONIX) 40 MG tablet Take 1 tablet (40 mg total) by mouth daily. 90 tablet 1  . PARoxetine (PAXIL) 40 MG tablet TAKE 1 TABLET (40 MG TOTAL) BY MOUTH EVERY MORNING. 90 tablet 1   No current facility-administered medications on file prior to visit.     ALLERGIES: Allergies  Allergen Reactions  . Bupropion Anxiety  . Chocolate Flavor   . Codeine     rash  . Pseudoephedrine     Heart beating through body.    FAMILY HISTORY: Family History  Problem Relation Age of  Onset  . Heart disease Father        Afib  . Hypertension Brother   . Heart disease Sister    SOCIAL HISTORY: Social History   Socioeconomic History  . Marital status: Married    Spouse name: Not on file  . Number of children: Not on file  . Years of education: Not on file  . Highest education level: Not on file  Occupational History  . Not on file  Social Needs  . Financial resource strain: Not on file  . Food insecurity:    Worry: Not on file    Inability: Not on file  .  Transportation needs:    Medical: Not on file    Non-medical: Not on file  Tobacco Use  . Smoking status: Never Smoker  . Smokeless tobacco: Never Used  Substance and Sexual Activity  . Alcohol use: No  . Drug use: No  . Sexual activity: Yes  Lifestyle  . Physical activity:    Days per week: Not on file    Minutes per session: Not on file  . Stress: Not on file  Relationships  . Social connections:    Talks on phone: Not on file    Gets together: Not on file    Attends religious service: Not on file    Active member of club or organization: Not on file    Attends meetings of clubs or organizations: Not on file    Relationship status: Not on file  . Intimate partner violence:    Fear of current or ex partner: Not on file    Emotionally abused: Not on file    Physically abused: Not on file    Forced sexual activity: Not on file  Other Topics Concern  . Not on file  Social History Narrative  . Not on file    REVIEW OF SYSTEMS: Constitutional: No fevers, chills, or sweats, no generalized fatigue, change in appetite Eyes: No visual changes, double vision, eye pain Ear, nose and throat: No hearing loss, ear pain, nasal congestion, sore throat Cardiovascular: No chest pain, palpitations Respiratory:  No shortness of breath at rest or with exertion, wheezes GastrointestinaI: No nausea, vomiting, diarrhea, abdominal pain, fecal incontinence Genitourinary:  No dysuria, urinary retention or frequency Musculoskeletal: Leg pain Integumentary: No rash, pruritus, skin lesions Neurological: as above Psychiatric: Anxiety Endocrine: No palpitations, fatigue, diaphoresis, mood swings, change in appetite, change in weight, increased thirst Hematologic/Lymphatic:  No purpura, petechiae. Allergic/Immunologic: no itchy/runny eyes, nasal congestion, recent allergic reactions, rashes  PHYSICAL EXAM: Blood pressure 122/74, pulse 61, height 5\' 4"  (1.626 m), weight 137 lb (62.1 kg), SpO2 98  %. General: No acute distress.  Anxious.  She became tearful when talking about her symptoms.  Patient appears well-groomed.  Head:  Normocephalic/atraumatic Eyes:  fundi examined but not visualized Neck: supple, no paraspinal tenderness, full range of motion Back: No paraspinal tenderness Heart: regular rate and rhythm Lungs: Clear to auscultation bilaterally. Vascular: No carotid bruits. Neurological Exam: Mental status: alert and oriented to person, place, and time, recent and remote memory intact, fund of knowledge intact, attention and concentration intact, speech fluent and not dysarthric, language intact. Cranial nerves: CN I: not tested CN II: pupils equal, round and reactive to light, visual fields intact CN III, IV, VI:  full range of motion, no nystagmus, no ptosis CN V: Endorsed reduced left V2 distribution CN VII: upper and lower face symmetric CN VIII: hearing intact CN IX, X: gag intact, uvula  midline CN XI: sternocleidomastoid and trapezius muscles intact CN XII: tongue midline Bulk & Tone: normal, no fasciculations. Motor:  5/5 throughout  Sensation: Pinprick sensation reduced in the left upper and lower extremities.  Vibratory sensation intact. Deep Tendon Reflexes:  3+ throughout, toes downgoing.  Finger to nose testing:  Without dysmetria.  Heel to shin:  Without dysmetria.  Gait:  Normal station and stride.  Able to turn and tandem walk. Romberg positive.  IMPRESSION: Various chronic symptomatology including weakness (mostly left sided), predominantly numbness and tingling, word-finding difficulty, memory deficits, balance/gait problems.  She does not have multiple sclerosis.  Vascular anomaly/possible cavernoma seen on MRI is an incidental finding.  She does not exhibit any objective neurological deficits on exam.  We will check for any B12 deficiency or thyroid abnormalities.  However, I believe her symptoms are most likely related to anxiety.  PLAN: We will  check B12 and TSH.  Further recommendations pending results.  If unremarkable, no further neurologic testing warranted.  ADDENDUM:  B12 and TSH are unremarkable.  Thank you for allowing me to take part in the care of this patient.  Shon Millet, DO  CC: Mechele Claude, MD

## 2018-01-29 ENCOUNTER — Telehealth: Payer: Self-pay | Admitting: *Deleted

## 2018-01-29 NOTE — Telephone Encounter (Signed)
-----   Message from Drema DallasAdam R Jaffe, DO sent at 01/29/2018  6:27 AM EST ----- Labs are normal

## 2018-01-29 NOTE — Telephone Encounter (Signed)
Left message informing patient that her labs are normal.  

## 2018-03-25 DIAGNOSIS — M797 Fibromyalgia: Secondary | ICD-10-CM | POA: Insufficient documentation

## 2018-04-19 DIAGNOSIS — N941 Unspecified dyspareunia: Secondary | ICD-10-CM | POA: Insufficient documentation

## 2018-04-19 DIAGNOSIS — N3946 Mixed incontinence: Secondary | ICD-10-CM | POA: Insufficient documentation

## 2018-06-27 ENCOUNTER — Ambulatory Visit: Payer: BLUE CROSS/BLUE SHIELD | Admitting: Family Medicine

## 2018-07-25 ENCOUNTER — Telehealth: Payer: Self-pay | Admitting: Family Medicine

## 2018-10-25 ENCOUNTER — Other Ambulatory Visit: Payer: Self-pay | Admitting: Family Medicine

## 2018-10-25 DIAGNOSIS — F1320 Sedative, hypnotic or anxiolytic dependence, uncomplicated: Secondary | ICD-10-CM | POA: Insufficient documentation

## 2019-01-02 ENCOUNTER — Other Ambulatory Visit: Payer: Self-pay | Admitting: Family Medicine

## 2019-03-04 ENCOUNTER — Other Ambulatory Visit: Payer: Self-pay | Admitting: Family Medicine

## 2019-10-13 DIAGNOSIS — R490 Dysphonia: Secondary | ICD-10-CM

## 2019-10-13 HISTORY — DX: Dysphonia: R49.0

## 2020-05-12 IMAGING — MR MR HEAD WO/W CM
7 of 13 series · 27 of 48 positions shown · IV contrast (multihance)
Comparison: None

CLINICAL DATA: BILATERAL weakness and numbness for 2 years.

EXAM:
MRI HEAD WITHOUT AND WITH CONTRAST
TECHNIQUE: Multiplanar, multiecho pulse sequences of the brain and surrounding
structures were obtained without and with intravenous contrast.
CONTRAST:  20 mL MultiHance.

[Series 3: DWI · axial · 3.0mm · 0.73mm/px · z∈[-47,+100]mm · 4 of 50 slices shown (1 of 4)]
[im 1/50]
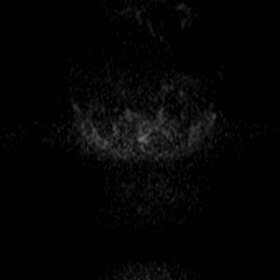
[im 17/50]
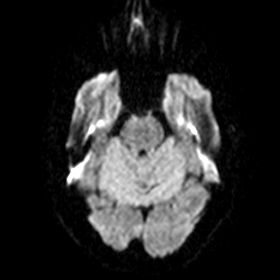
[im 33/50]
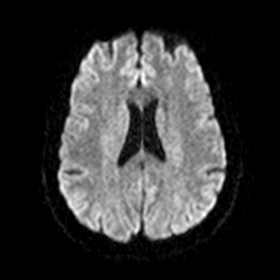
[im 50/50]
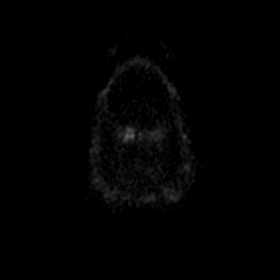

[Series 4: DWI · axial · 3.0mm · 0.73mm/px · z∈[-47,+100]mm · 4 of 50 slices shown (2 of 4)]
[im 1/50]
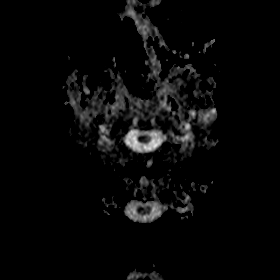
[im 17/50]
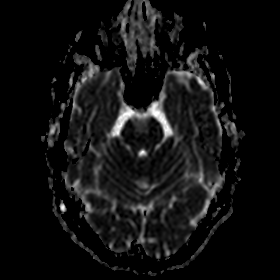
[im 33/50]
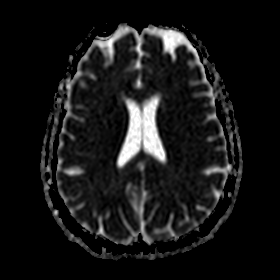
[im 50/50]
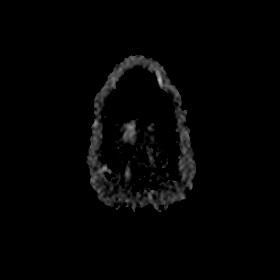

[Series 5: DWI · coronal · 5.0mm · 0.50mm/px · 3 of 34 slices shown (3 of 4)]
[im 1/34]
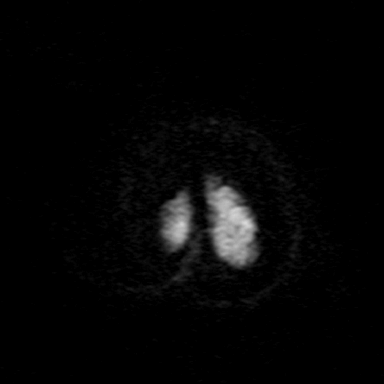
[im 17/34]
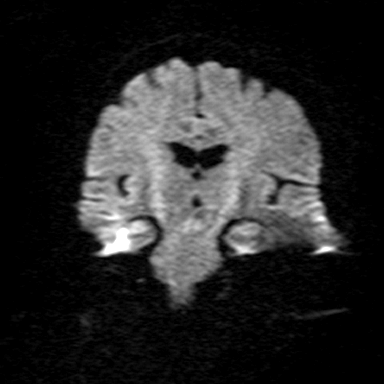
[im 34/34]
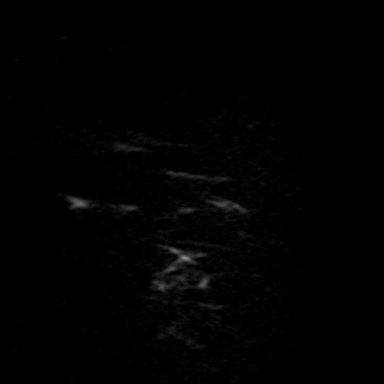

[Series 6: DWI · coronal · 5.0mm · 0.50mm/px · 3 of 34 slices shown (4 of 4)]
[im 1/34]
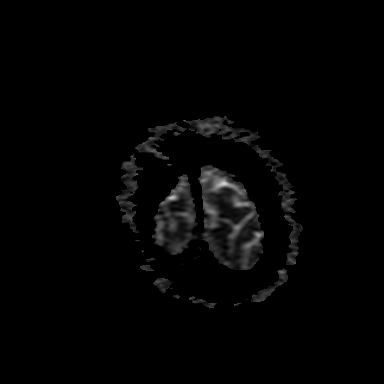
[im 17/34]
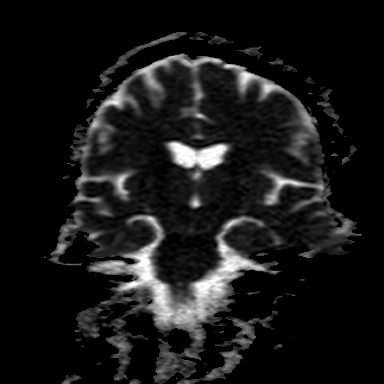
[im 34/34]
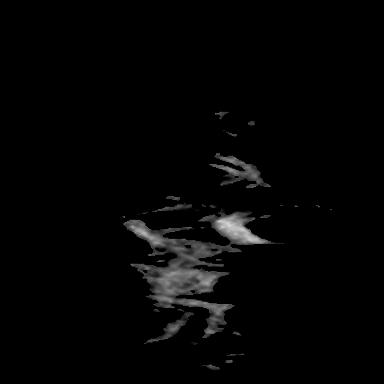

[Series 9: FLAIR · axial · 3.0mm · 0.34mm/px · z∈[-42,+96]mm · 4 of 47 slices shown]
[im 1/47]
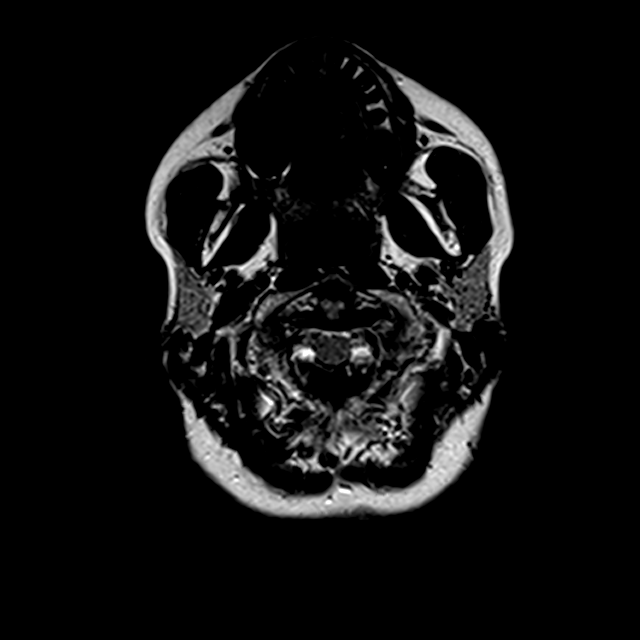
[im 16/47]
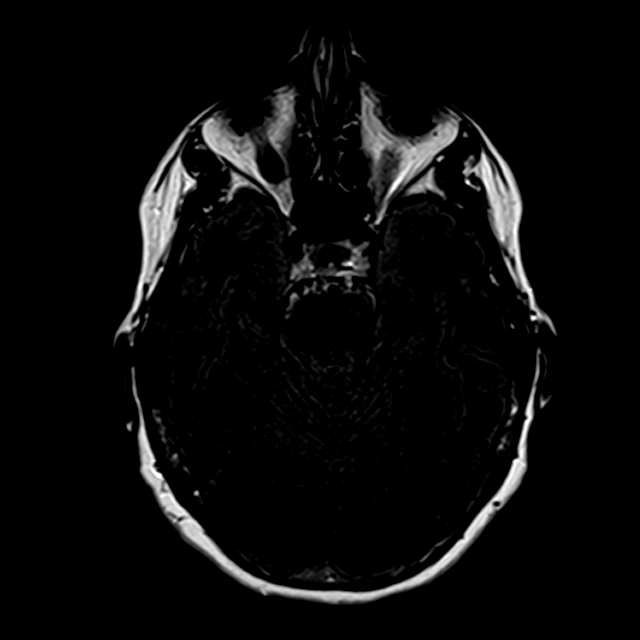
[im 31/47]
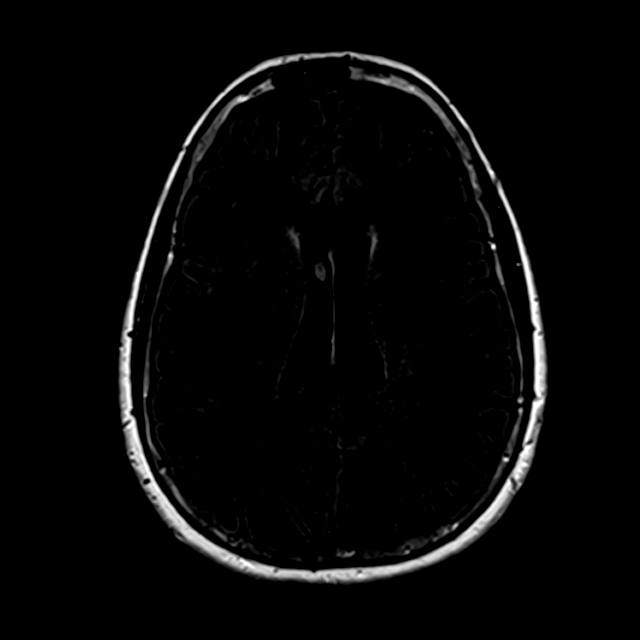
[im 47/47]
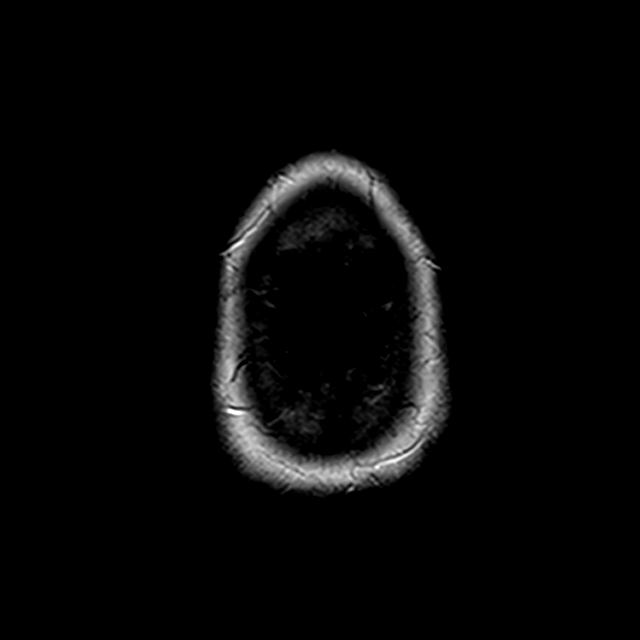

[Series 14: T1 post-contrast · axial · 2.0mm · 0.45mm/px · z∈[-76,+130]mm · 8 of 104 slices shown (1 of 2)]
[im 1/104]
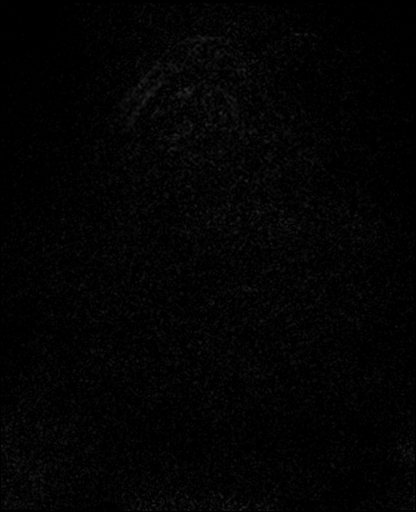
[im 15/104]
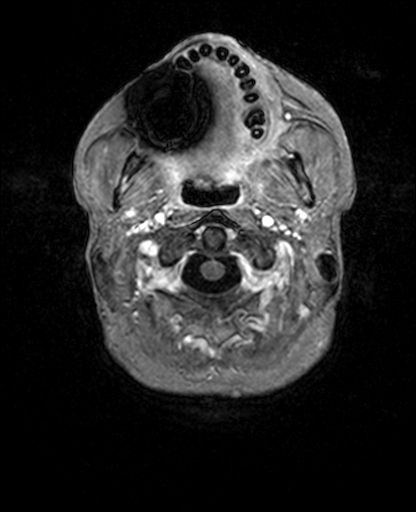
[im 30/104]
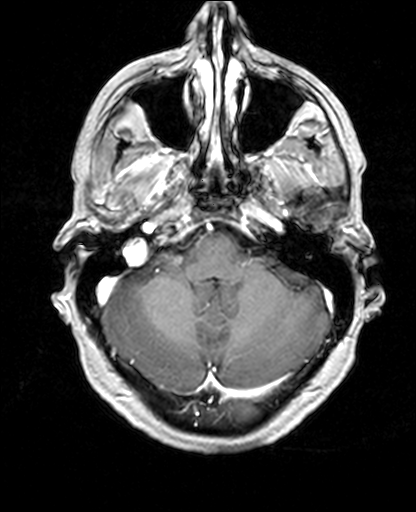
[im 45/104]
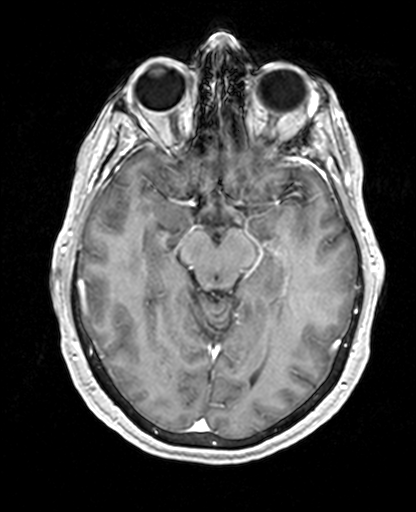
[im 59/104]
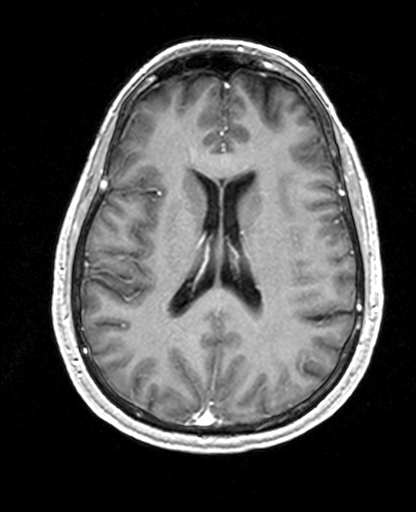
[im 74/104]
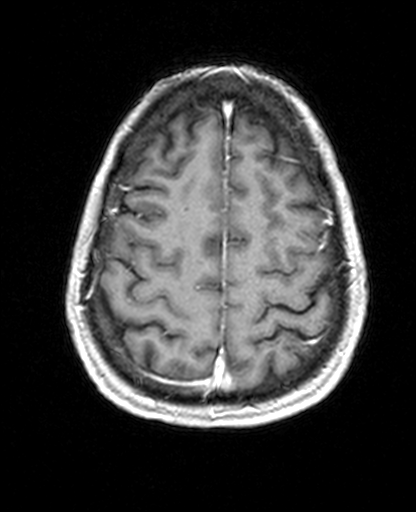
[im 89/104]
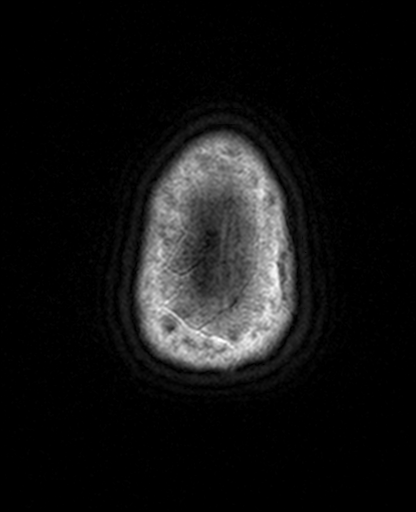
[im 104/104]
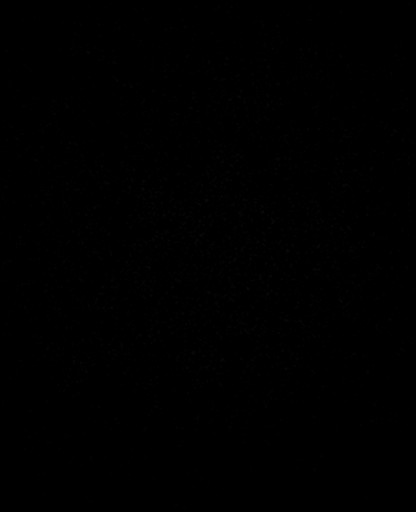

[Series 15: T1 post-contrast · coronal · 5.0mm · 0.39mm/px · 1 of 48 slices shown (2 of 2)]
[im 1/48]
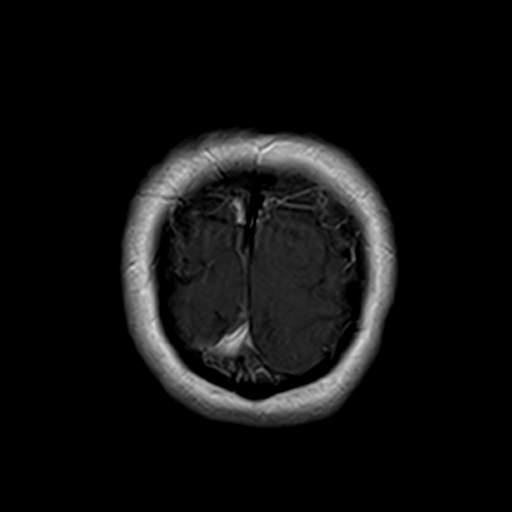

[27 of 48 positions shown; findings below may reference images not displayed]

FINDINGS: Brain: No evidence acute stroke, acute hemorrhage, hydrocephalus, or
extra-axial fluid. Normal cerebral volume. No white matter disease.

In the RIGHT insula, there is an 8 x 10 mm lesion with intrinsic T1
shortening, characteristic "popcorn" appearance, marked
susceptibility due to hemosiderin, well-defined margins from
surrounding parenchyma, as well as slight peripheral postcontrast
enhancement. Findings are those of a slow flow venous malformation
(cavernous venous malformation/cavernoma). No associated
developmental venous draining vein. No other similar intra-axial
lesions.

Post infusion, no other areas of abnormal postcontrast enhancement.

Vascular: Flow voids are maintained. Major dural venous sinuses are
patent.

Skull and upper cervical spine: Unremarkable visualized calvarium,
skullbase, and cervical vertebrae. Pituitary, pineal, cerebellar
tonsils unremarkable. No upper cervical cord lesions.

Sinuses/Orbits: No orbital masses or proptosis. Globes appear
symmetric. Sinuses appear well aerated, without evidence for
air-fluid level.

Other: No mastoid fluid. Unremarkable nasopharynx. Scalp soft
tissues unremarkable.
IMPRESSION: 8 x 10 mm lesion with imaging characteristics consistent with a
solitary, slow flow venous malformation of the RIGHT insula
(cavernous venous malformation/cavernoma). No evidence of recent
hemorrhage. This likely represents an incidental finding.

No acute intracranial abnormality. No significant postcontrast
enhancement.

## 2022-08-22 DIAGNOSIS — R001 Bradycardia, unspecified: Secondary | ICD-10-CM | POA: Insufficient documentation

## 2022-10-24 ENCOUNTER — Ambulatory Visit: Payer: BLUE CROSS/BLUE SHIELD | Admitting: Nurse Practitioner

## 2022-10-24 NOTE — Progress Notes (Deleted)
New Patient Office Visit  Subjective    Patient ID: Sandra Deleon, female    DOB: Jun 18, 1960  Age: 62 y.o. MRN: [id number]  CC: No chief complaint on file.   HPI Sandra Deleon presents to establish care ***  Outpatient Encounter Medications as of 10/24/2022  Medication Sig   albuterol (PROVENTIL HFA;VENTOLIN HFA) 108 (90 Base) MCG/ACT inhaler Inhale 1-2 puffs into the lungs every 6 (six) hours as needed for wheezing.   albuterol (PROVENTIL) (2.5 MG/3ML) 0.083% nebulizer solution Take 3 mLs (2.5 mg total) by nebulization every 6 (six) hours as needed for wheezing or shortness of breath.   clonazePAM (KLONOPIN) 1 MG tablet Take 1 tablet (1 mg total) by mouth 3 (three) times daily.   fluticasone furoate-vilanterol (BREO ELLIPTA) 100-25 MCG/INH AEPB Inhale 1 puff into the lungs daily.   montelukast (SINGULAIR) 10 MG tablet Take 1 tablet (10 mg total) by mouth at bedtime.   Multiple Vitamin (MULTIVITAMIN) tablet Take 1 tablet by mouth daily.   pantoprazole (PROTONIX) 40 MG tablet Take 1 tablet (40 mg total) by mouth daily. (Needs to be seen before next refill)   PARoxetine (PAXIL) 40 MG tablet TAKE 1 TABLET (40 MG TOTAL) BY MOUTH EVERY MORNING.   No facility-administered encounter medications on file as of 10/24/2022.    Past Medical History:  Diagnosis Date   Anxiety    Asthma    GERD (gastroesophageal reflux disease)    Hiatal hernia     Past Surgical History:  Procedure Laterality Date   ESOPHAGOGASTRODUODENOSCOPY  04/09/2008   with Sandra Deleon dilation   ESOPHAGOGASTRODUODENOSCOPY  04/25/2005   NOSE SURGERY     blockage removed from right side of nose   PARTIAL HYSTERECTOMY     still has ovaries   VAGINA RECONSTRUCTION SURGERY     past childbirth    Family History  Problem Relation Age of Onset   Pulmonary embolism Mother    Hypertension Mother    Heart disease Father        Afib   Prostate cancer Father    COPD Sister    Hypertension Brother    Heart disease  Sister    Heart disease Sister    Prostate cancer Maternal Grandfather    Pneumonia Paternal Grandmother    Pulmonary disease Paternal Grandfather     Social History   Socioeconomic History   Marital status: Married    Spouse name: Sandra Deleon   Number of children: 2   Years of education: Not on file   Highest education level: GED or equivalent  Occupational History   Occupation: part time  Tobacco Use   Smoking status: Never   Smokeless tobacco: Never  Substance and Sexual Activity   Alcohol use: No   Drug use: No   Sexual activity: Yes  Other Topics Concern   Not on file  Social History Narrative   Patient is right-handed. She lives with her husband in a one level home. She does not drink caffeine. She exercises 3-5 x a week.   Social Determinants of Health   Financial Resource Strain: Low Risk  (05/26/2020)   Received from [facility] visits prior to 05/13/2022.   Overall Financial Resource Strain (CARDIA)    Difficulty of Paying Living Expenses: Not very hard  Food Insecurity: No Food Insecurity (06/17/2021)   Received from [facility], [facility] visits prior to 05/13/2022.   Hunger Vital Sign    Worried About Running  Out of Food in the Last Year: Never true    Ran Out of Food in the Last Year: Never true  Transportation Needs: No Transportation Needs (06/17/2021)   Received from [facility], [facility] visits prior to 05/13/2022.   PRAPARE - Administrator, Civil Service (Medical): No    Lack of Transportation (Non-Medical): No  Physical Activity: Sufficiently Active (06/17/2021)   Received from [facility], [facility] visits prior to 05/13/2022.   Exercise Vital Sign    Days of Exercise per Week: 5 days    Minutes of Exercise per Session: 40 min  Stress: Stress Concern Present (06/17/2021)   Received from [facility], [facility] visits prior to  05/13/2022.   [employer] of Occupational Health - Occupational Stress Questionnaire    Feeling of Stress : To some extent  Social Connections: Moderately Integrated (06/17/2021)   Received from [facility], [facility] visits prior to 05/13/2022.   Social Advertising account executive [NHANES]    Frequency of Communication with Friends and Family: Twice a week    Frequency of Social Gatherings with Friends and Family: More than three times a week    Attends Religious Services: More than 4 times per year    Active Member of [employer] or Organizations: No    Attends Banker Meetings: Not on file    Marital Status: Married  Intimate Partner Violence: Not At Risk (06/17/2021)   Received from [facility] visits prior to 05/13/2022.   Humiliation, Afraid, Rape, and Kick questionnaire    Fear of Current or Ex-Partner: No    Emotionally Abused: No    Physically Abused: No    Sexually Abused: No    ROS Negative unless indicated in HPI   Objective    There were no vitals taken for this visit.  Physical Exam  {Labs (Optional):23779}    Assessment & Plan:  Routine medical exam    No follow-ups on file.   @  Sandra Morn DNP@

## 2022-12-01 DIAGNOSIS — Q282 Arteriovenous malformation of cerebral vessels: Secondary | ICD-10-CM | POA: Insufficient documentation

## 2023-05-11 ENCOUNTER — Encounter: Payer: Self-pay | Admitting: Family Medicine

## 2023-05-11 ENCOUNTER — Ambulatory Visit (INDEPENDENT_AMBULATORY_CARE_PROVIDER_SITE_OTHER): Payer: Medicaid Other | Admitting: Family Medicine

## 2023-05-11 VITALS — BP 110/72 | HR 73 | Temp 98.0°F | Ht 64.0 in | Wt 142.0 lb

## 2023-05-11 DIAGNOSIS — J4521 Mild intermittent asthma with (acute) exacerbation: Secondary | ICD-10-CM | POA: Diagnosis not present

## 2023-05-11 DIAGNOSIS — F1320 Sedative, hypnotic or anxiolytic dependence, uncomplicated: Secondary | ICD-10-CM

## 2023-05-11 DIAGNOSIS — Q282 Arteriovenous malformation of cerebral vessels: Secondary | ICD-10-CM

## 2023-05-11 DIAGNOSIS — F411 Generalized anxiety disorder: Secondary | ICD-10-CM

## 2023-05-11 DIAGNOSIS — F330 Major depressive disorder, recurrent, mild: Secondary | ICD-10-CM

## 2023-05-11 MED ORDER — PREDNISONE 20 MG PO TABS: 40.0000 mg | ORAL_TABLET | Freq: Every day | ORAL | 0 refills | Status: AC

## 2023-05-11 NOTE — Progress Notes (Signed)
 New Patient Office Visit  Subjective   Patient ID: Sandra Deleon, female    DOB: 1960-07-18  Age: 63 y.o. MRN: [id number]  CC:  Chief Complaint  Patient presents with   New Patient (Initial Visit)    Establish care    HPI Sandra Deleon presents to establish care States that she is transitioning from [facility] due to insurance coverage. However, she notes that insurance may change again, tomorrow due to her birthday, and she may need to change provider again.   AVM  Established with neurologist, Moser. Reports that she has "small seizures". Reports that she missed her last appt with neuro because she does not wish to start treatment for seizures. States that she has poor memory due to the AVM. Reports that she also has jerky movements of her extremities and numbness/tingling   Asthma  Reports history of bronchitis, believes that she currently has asthma exacerbation as she started a few weeks ago with nasal congestion and rhinorrhea. States that she hears herself wheeze on occasion. She is currently using nebulizer, 3 times per day. Typically she using inhaler as needed. Denies any hospitalizations, intubations in the last 3 months. Continues to take singulair.   Depression/Anxiety  States that lexapro really helps with her depression. Endorses passing thoughts of self harm, no active plans. States that she relies on faith and exercise (walking and stretching). She takes two klonopin tablets in the morning and one at night for anxiety. She also takes remeron at night to help her sleep. She is not currently in counseling or seeing psychiatry. States that she had trouble in the past when she was taking paxil and switched to taking lexapro.    Outpatient Encounter Medications as of 05/11/2023  Medication Sig   albuterol (PROVENTIL) (2.5 MG/3ML) 0.083% nebulizer solution Take 3 mLs (2.5 mg total) by nebulization every 6 (six) hours as needed for wheezing or shortness of breath.    clonazePAM (KLONOPIN) 0.5 MG tablet Take by mouth.   escitalopram (LEXAPRO) 20 MG tablet Take 20 mg by mouth daily.   fluticasone furoate-vilanterol (BREO ELLIPTA) 100-25 MCG/INH AEPB Inhale 1 puff into the lungs daily.   mirtazapine (REMERON) 15 MG tablet Take 15 mg by mouth at bedtime.   montelukast (SINGULAIR) 10 MG tablet Take 1 tablet (10 mg total) by mouth at bedtime.   Multiple Vitamin (MULTIVITAMIN) tablet Take 1 tablet by mouth daily.   omega-3 acid ethyl esters (LOVAZA) 1 g capsule Take by mouth.   pantoprazole (PROTONIX) 40 MG tablet Take 1 tablet (40 mg total) by mouth daily. (Needs to be seen before next refill)   albuterol (PROVENTIL HFA;VENTOLIN HFA) 108 (90 Base) MCG/ACT inhaler Inhale 1-2 puffs into the lungs every 6 (six) hours as needed for wheezing.   [DISCONTINUED] clonazePAM (KLONOPIN) 1 MG tablet Take 1 tablet (1 mg total) by mouth 3 (three) times daily.   [DISCONTINUED] PARoxetine (PAXIL) 40 MG tablet TAKE 1 TABLET (40 MG TOTAL) BY MOUTH EVERY MORNING.   No facility-administered encounter medications on file as of 05/11/2023.    Past Medical History:  Diagnosis Date   Anxiety    Asthma    GERD (gastroesophageal reflux disease)    Hiatal hernia    Hoarse 10/13/2019    Past Surgical History:  Procedure Laterality Date   ESOPHAGOGASTRODUODENOSCOPY  04/09/2008   with Elease Hashimoto dilation   ESOPHAGOGASTRODUODENOSCOPY  04/25/2005   NOSE SURGERY     blockage removed from right side of nose  PARTIAL HYSTERECTOMY     still has ovaries   VAGINA RECONSTRUCTION SURGERY     past childbirth    Family History  Problem Relation Age of Onset   Pulmonary embolism Mother    Hypertension Mother    Heart disease Father        Afib   Prostate cancer Father    COPD Sister    Hypertension Brother    Heart disease Sister    Heart disease Sister    Prostate cancer Maternal Grandfather    Pneumonia Paternal Grandmother    Pulmonary disease Paternal Grandfather      Social History   Socioeconomic History   Marital status: Married    Spouse name: Marina Goodell   Number of children: 2   Years of education: Not on file   Highest education level: GED or equivalent  Occupational History   Occupation: part time  Tobacco Use   Smoking status: Never   Smokeless tobacco: Never  Substance and Sexual Activity   Alcohol use: No   Drug use: No   Sexual activity: Yes  Other Topics Concern   Not on file  Social History Narrative   Patient is right-handed. She lives with her husband in a one level home. She does not drink caffeine. She exercises 3-5 x a week.   Social Drivers of Health   Financial Resource Strain: Low Risk  (05/26/2020)   Received from [facility] visits prior to 05/13/2022.   Overall Financial Resource Strain (CARDIA)    Difficulty of Paying Living Expenses: Not very hard  Food Insecurity: No Food Insecurity (05/11/2023)   Hunger Vital Sign    Worried About Running Out of Food in the Last Year: Never true    Ran Out of Food in the Last Year: Never true  Transportation Needs: No Transportation Needs (05/11/2023)   PRAPARE - Administrator, Civil Service (Medical): No    Lack of Transportation (Non-Medical): No  Physical Activity: Sufficiently Active (06/17/2021)   Received from [facility], [facility] visits prior to 05/13/2022.   Exercise Vital Sign    Days of Exercise per Week: 5 days    Minutes of Exercise per Session: 40 min  Stress: Stress Concern Present (06/17/2021)   Received [facility], [facility] visits prior to 05/13/2022.   [employer] of Occupational Health - Occupational Stress Questionnaire    Feeling of Stress : To some extent  Social Connections: Moderately Integrated (06/17/2021)   [facility], [facility] visits prior to 05/13/2022.   Social Advertising account executive [NHANES]    Frequency of  Communication with Friends and Family: Twice a week    Frequency of Social Gatherings with Friends and Family: More than three times a week    Attends Religious Services: More than 4 times per year    Active Member of [employer] or Organizations: No    Attends Engineer, structural: Not on file    Marital Status: Married  Catering manager Violence: Not At Risk (05/11/2023)   Humiliation, Afraid, Rape, and Kick questionnaire    Fear of Current or Ex-Partner: No    Emotionally Abused: No    Physically Abused: No    Sexually Abused: No    ROS As per HPI  Objective   BP 110/72   Pulse 73   Temp 98 F (36.7 C)   Ht 5\' 4"  (1.626  m)   Wt 142 lb (64.4 kg)   SpO2 98%   BMI 24.37 kg/m   Physical Exam Constitutional:      General: She is awake. She is not in acute distress.    Appearance: Normal appearance. She is well-developed and well-groomed. She is not ill-appearing, toxic-appearing or diaphoretic.  Cardiovascular:     Rate and Rhythm: Normal rate and regular rhythm.     Pulses: Normal pulses.          Radial pulses are 2+ on the right side and 2+ on the left side.       Posterior tibial pulses are 2+ on the right side and 2+ on the left side.     Heart sounds: Normal heart sounds. No murmur heard.    No gallop.  Pulmonary:     Effort: Pulmonary effort is normal. No respiratory distress.     Breath sounds: Normal breath sounds. No stridor. No wheezing, rhonchi or rales.  Musculoskeletal:     Cervical back: Full passive range of motion without pain and neck supple.     Right lower leg: No edema.     Left lower leg: No edema.  Skin:    General: Skin is warm.     Capillary Refill: Capillary refill takes less than 2 seconds.  Neurological:     General: No focal deficit present.     Mental Status: She is alert, oriented to person, place, and time and easily aroused. Mental status is at baseline.     GCS: GCS eye subscore is 4. GCS verbal subscore is 5. GCS motor subscore is  6.     Motor: No weakness.  Psychiatric:        Attention and Perception: Attention and perception normal.        Mood and Affect: Affect normal. Mood is anxious.        Speech: Speech is tangential. Speech is not rapid and pressured.        Behavior: Behavior is hyperactive. Behavior is cooperative.        Thought Content: Thought content normal. Thought content does not include homicidal or suicidal ideation. Thought content does not include homicidal or suicidal plan.        Cognition and Memory: Cognition and memory normal.        Judgment: Judgment normal.       05/11/2023    1:11 PM 12/26/2017    2:01 PM 11/30/2017    8:06 AM  Depression screen PHQ 2/9  Decreased Interest 0 0 0  Down, Depressed, Hopeless 0 0 0  PHQ - 2 Score 0 0 0  Altered sleeping 1    Tired, decreased energy 1    Change in appetite 0    Feeling bad or failure about yourself  1    Trouble concentrating 1    Moving slowly or fidgety/restless 1    Suicidal thoughts 0    PHQ-9 Score 5        05/11/2023    1:11 PM 06/05/2016   10:22 AM  GAD 7 : Generalized Anxiety Score  Nervous, Anxious, on Edge 0 2  Control/stop worrying 0 0  Worry too much - different things 0 1  Trouble relaxing 0 1  Restless 0 0  Easily annoyed or irritable 0 0  Afraid - awful might happen 0 1  Total GAD 7 Score 0 5  Anxiety Difficulty Somewhat difficult Not difficult at all   Assessment & Plan:  1. Mild intermittent asthma with exacerbation (Primary) Will start medication as below for symptom management.  - predniSONE (DELTASONE) 20 MG tablet; Take 2 tablets (40 mg total) by mouth daily with breakfast for 5 days.  Dispense: 10 tablet; Refill: 0  2. Benzodiazepine dependence (HCC) Discussed with patient that I recommend she taper off of benzodiazepine as long term use comes with significant side effects. Patient offered to taper off with PCP or follow up with psychiatry to continue. Discussed other options for treatment. She  does not wish to start taper at this time. Referral placed. Denies SI. Safety contract established. Reviewed EEG results with patient from Maple Hudson, MD 02/01/2023. Of note, during exam today patient experienced "seizure" in which she started shaking her left leg, closed her eyes, and rested. She did not have LOC, continued to talk and was aware of her surrounding during event, responsive to verbal stimuli, no loss of motor tone, no incontinence or biting of tongue. Event lasted ~30 seconds. Reviewed notes from Maple Hudson, MD 12/15/2022 Do not see in chart review where she has been formally diagnosed with seizures from neurology. Concern for conversion disorder and recommended psychiatric follow up.  - Ambulatory referral to Psychiatry - ToxASSURE Select 13 (MW), Urine  EEG Procedure  Impression: This is a normal 24-hour EEG without video patient's diary reports no events.  The only abnormality was frontal beta activity which would be consistent with her use of clonazepam.   3. Mild episode of recurrent major depressive disorder (HCC) As above.   4. Generalized anxiety disorder As above.   5. AVM (arteriovenous malformation) brain Established with neurology. Requested that patient follow up with specialty.   The above assessment and management plan was discussed with the patient. The patient verbalized understanding of and has agreed to the management plan using shared-decision making. Patient is aware to call the clinic if they develop any new symptoms or if symptoms fail to improve or worsen. Patient is aware when to return to the clinic for a follow-up visit. Patient educated on when it is appropriate to go to the emergency department.   Return in about 3 months (around 08/08/2023) for Chronic Condition Follow up.   Neale Burly, DNP-FNP [facility] [address] [place], [place] [address] [phone]

## 2023-05-15 LAB — TOXASSURE SELECT 13 (MW), URINE

## 2023-05-16 ENCOUNTER — Encounter: Payer: Self-pay | Admitting: Family Medicine

## 2023-05-16 NOTE — Progress Notes (Signed)
 Toxassure as expected. Please have patient follow up with psychiatry to continue

## 2023-06-01 ENCOUNTER — Telehealth: Payer: Self-pay | Admitting: Family Medicine

## 2023-06-01 ENCOUNTER — Ambulatory Visit (HOSPITAL_COMMUNITY)
Admission: EM | Admit: 2023-06-01 | Discharge: 2023-06-01 | Disposition: A | Discharge status: Home / Self Care | Attending: Behavioral Health | Admitting: Behavioral Health

## 2023-06-01 DIAGNOSIS — F13930 Sedative, hypnotic or anxiolytic use, unspecified with withdrawal, uncomplicated: Secondary | ICD-10-CM

## 2023-06-01 MED ORDER — CLONAZEPAM 0.5 MG PO TABS
0.5000 mg | ORAL_TABLET | Freq: Once | ORAL | Status: AC
  Administered 2023-06-01: 0.5 mg via ORAL
  Filled 2023-06-01: qty 1

## 2023-06-01 NOTE — Telephone Encounter (Signed)
 Patient walked into clinic today. States that she went to [facility] today for assistance with weaning of of benzodiazepine.  Reports that [facility] offered to help monitor her but she did not want inpatient care. She reports she tried to self wean. She previously reported taking two 0.5 Klonipin tablets in the morning and one in the evenings. 2 weeks ago tried to self wean. Started taking one in morning and one in evening for one week. 5 days ago stopped am dose and is currently taking evening only. Reports that she is now experiencing chills confusion, body jerking, nausea, brain fog. States that today is the worst day. She has additional klonipin at home. She does not wish to take more. Denies SI.  I discussed with patient that she is having withdrawal symptoms as she tapered too quickly from benzodiazepine. Instructed patient to present to [facility] urgent care or ED. Provided patient with contact information for [facility] urgent care. Patient reluctant to go. States that her husband will drive her. She eventually agreed to go. Discussed with patient that if she does not present to receive care that we will contact first responders to complete a wellness check on patient.  Will send an urgent note to referrals coordinator to get patient established with psychiatry that will assist with wean.

## 2023-06-01 NOTE — Progress Notes (Signed)
   06/01/23 1645  [facility] Triage Screening (Walk-ins at [facility] only)  What Is the Reason for Your Visit/Call Today? Pt presents to [facility] voluntarily accompanied by her husband. Pt states that she is detoxing from antidepressants. Pt states that she is looking for a therapist and assistance with medications. Pt denies SI, HI, AVH, Alcohol/Drug use, Abuse.  How Long Has This Been Causing You Problems? 1 wk - 1 month  Have You Recently Had Any Thoughts About Hurting Yourself?  (Pt is unsure)  Are You Planning to Commit Suicide/Harm Yourself At This time? No  Have you Recently Had Thoughts About Hurting Someone Karolee Ohs? No  Are You Planning To Harm Someone At This Time? No  Physical Abuse Denies  Verbal Abuse Denies  Sexual Abuse Denies  Exploitation of patient/patient's resources Denies  Self-Neglect Denies  Are you currently experiencing any auditory, visual or other hallucinations? No  Have You Used Any Alcohol or Drugs in the Past 24 Hours? No  Do you have any current medical co-morbidities that require immediate attention? Yes  Please describe current medical co-morbidities that require immediate attention: AVM  Clinician description of patient physical appearance/behavior: Pt is cooperative and giggly  What Do You Feel Would Help You the Most Today? Medication(s);Treatment for Depression or other mood problem  Determination of Need Routine (7 days)  Options For Referral Medication Management;Outpatient Therapy

## 2023-06-01 NOTE — ED Provider Notes (Signed)
 Behavioral Health Urgent Care Medical Screening Exam  Patient Name: Sandra Deleon MRN: [id number] Date of Evaluation: 06/01/23 Chief Complaint:  requesting resources for outpatient psychiatry for medication management and counseling services. Diagnosis:  Final diagnoses:  Benzodiazepine withdrawal without complication (HCC)    History of Present illness: Sandra Deleon is a 63 y.o. female patient with a past psychiatric history significant for benzodiazepine dependence, MDD and generalized anxiety disorder who presented to the [facility] voluntary accompanied by her husband Ailana Cuadrado requesting resources for outpatient psychiatry for medication management and counseling services.  Patient reports that she has been trying to come off of clonazepam for the past month. Patient is currently prescribed clonazepam 0.5 mg po TID by her PCP Lady Deutscher, NP., at [facility]. Per PDMP, patient filled clonazepam 0.5 mg 90 QTY x 30 days on 05/11/23.  Patient reports trying to come off of the clonazepam on her own and has decreased the clonazepam 0.5 mg from taking (2) 0.5 mg in the morning and (1) 0.5 mg at bedtime to (1) 0.5 mg at bedtime for the past month. She reports experiencing withdrawal symptoms of numbness, chest tightness, nausea and muscle spasms/stiffness. She reports also taking Lexapro 20 mg po daily and Remeron 15 mg po at bedtime for depression and anxiety.   The patient's husband Sandra Deleon, states that he recommended his wife to go see his Nurse Practitioner at [facility] last month and she was recommended to come of the clonazepam due to long-term side effects. He states that his wife was initially upset about the medication recommendations.   Upon evaluation, patient denies SI/HI/AVH. There is no objective evidence that the patient is currently responding to internal or external stimuli. Patient is calm and cooperative and  does not appear to be in acute distress.  Plan of care: We will administer a one-time dose of clonazepam 0.5 mg to decrease withdrawal symptoms. Patient advised to continue taking clonazepam as prescribed by primary care physician and to consult with her primary care physician to wean off of clonazepam as a can cause severe withdrawal symptoms. Patient provided with education handout on benzodiazepine withdrawal symptoms. Patient encouraged to follow up with outpatient psychiatry and counseling to address mental health concerns. Patient provided with a list of outpatient facilities for psychiatry and counseling. Patient is stable to discharge at this time. Safety planning completed prior to discharge. Patient is transported home by her husband.   Flowsheet Row ED from 06/01/2023 in [facility]  C-SSRS RISK CATEGORY No Risk       Psychiatric Specialty Exam  Presentation  General Appearance:Appropriate for Environment  Eye Contact:Fair  Speech:Clear and Coherent  Speech Volume:Normal  Handedness:Right   Mood and Affect  Mood: Anxious  Affect: Congruent   Thought Process  Thought Processes: Coherent  Descriptions of Associations:Intact  Orientation:Full (Time, Place and Person)  Thought Content:Logical    Hallucinations:None  Ideas of Reference:None  Suicidal Thoughts:No  Homicidal Thoughts:No   Sensorium  Memory: Immediate Fair; Recent Fair; Remote Fair  Judgment: Fair  Insight: Fair   Art therapist  Concentration: Fair  Attention Span: Fair  Recall: [employer] of Knowledge: Fair  Language: Fair   Psychomotor Activity  Psychomotor Activity: Normal   Assets  Assets: Manufacturing systems engineer; Desire for Improvement; Financial Resources/Insurance; Housing; Intimacy; Leisure Time; Physical Health; Social Support; Transportation   Sleep  Sleep: Fair   Physical Exam: Physical Exam Cardiovascular:  Rate and Rhythm: Normal rate.  Pulmonary:     Effort: Pulmonary effort is normal.  Musculoskeletal:        General: Normal range of motion.     Cervical back: Normal range of motion.  Neurological:     Mental Status: She is alert and oriented to person, place, and time.    Review of Systems  Constitutional: Negative.   Eyes: Negative.   Respiratory: Negative.    Cardiovascular: Negative.   Gastrointestinal: Negative.   Genitourinary: Negative.   Musculoskeletal: Negative.   Neurological: Negative.   Endo/Heme/Allergies: Negative.    Blood pressure 136/71, pulse 64, temperature 97.7 F (36.5 C), resp. rate 18, SpO2 100%. There is no height or weight on file to calculate BMI.  Musculoskeletal: Strength & Muscle Tone: within normal limits Gait & Station: normal Patient leans: N/A   [facility] MSE Discharge Disposition for Follow up and Recommendations: Based on my evaluation the patient does not appear to have an emergency medical condition and can be discharged with resources and follow up care in outpatient services for Medication Management and Individual Therapy  Discharge recommendations:   Medications: Patient is to take medications as prescribed. Please consult with your primary care physician to discuss weaning off of the clonazepam as it can cause severe withdrawal symptoms.  Outpatient Follow up: Please review list of outpatient resources for psychiatry and counseling. Please follow up with your primary care provider for all medical related needs.   [facility] at [facility] [address]  [place], [place] [address] [phone] Offers medication management and therapy.  [facility] Recovery Services - [facility]  Address: [address] [place], [place] [address] Phone: [phone]  [facility] Health Outpatient Behavioral Health at [facility] Address: [address], [place], [place] [address] Phone: [phone]  [facility] Outpatient Office - [place] Address: [address] [facility], [address], [place], [place] [address] Phone: [phone]  Therapy: We recommend that patient participate in individual therapy to address anxiety and mental health concerns.  Safety:   The following safety precautions should be taken:   No sharp objects. This includes scissors, razors, scrapers, and putty knives.   Chemicals should be removed and locked up.   Medications should be removed and locked up.   Weapons should be removed and locked up. This includes firearms, knives and instruments that can be used to cause injury.   The patient should abstain from use of illicit substances/drugs and abuse of any medications.  If symptoms worsen or do not continue to improve or if the patient becomes actively suicidal or homicidal then it is recommended that the patient return to the closest hospital emergency department, the [facility], or call 911 for further evaluation and treatment. National Suicide Prevention Lifeline 1-800-SUICIDE or 574-273-1119.  About 988 988 offers 24/7 access to trained crisis counselors who can help people experiencing mental health-related distress. People can call or text 988 or chat 988lifeline.org for themselves or if they are worried about a loved one who may need crisis support.   Benzodiazepine Withdrawal Benzodiazepine withdrawal is a group of physical and mental symptoms that happen when you suddenly stop taking benzodiazepines. Benzodiazepines are prescription medicines that decrease, or depress, the activity of the central nervous system and cause changes in certain brain chemicals (neurotransmitters). There are many types of benzodiazepines. Some benzodiazepines take effect quickly and stay in your body for a  short amount of time (short-acting). Other benzodiazepines require more time to take effect and stay in your  body for longer amounts of time (long-acting). The five most commonly prescribed benzodiazepines are: Alprazolam. Lorazepam. Clonazepam. Diazepam. Temazepam. What are the causes? When you take benzodiazepines, your brain needs more and more of the medicine over time to get the same effects from it. This increased need is called tolerance. As you develop a tolerance, your brain adapts to the effects of the benzodiazepine and relies on these effects. This is called dependency. Withdrawal happens when you suddenly stop taking your medicine. This does not give your brain enough time to adapt to not having the medicine. What increases the risk? You are more likely to develop this condition if: You have taken benzodiazepines for more than 1-2 weeks. You have developed a tolerance for benzodiazepines, or dependence on them. You take high dosages of benzodiazepines or doses that are higher than prescribed. You take benzodiazepines without a prescription. You use benzodiazepines with other substances that depress the central nervous system, such as alcohol. You have a history of drug or alcohol abuse. What are the signs or symptoms? Symptoms of this condition may include: Difficulty sleeping. Anxiety. Restlessness. Irritability. Muscle aches. Vomiting. Sweating. Other symptoms may include: Headaches. Involuntary shaking or trembling of a part of your body (tremor). Confusion and poor concentration. Feeling or seeing things that are not there (hallucinations). Seizures. Symptoms of withdrawal from short-acting benzodiazepines may develop 1-2 days after you stop taking your medicine, and they may last for 2-4 weeks or longer. Symptoms of withdrawal from long-acting benzodiazepines may develop 2-7 days after you stop taking your medicine, and they may last for 2-8 weeks or longer. How is this diagnosed? This condition may be diagnosed based on: Your symptoms. Your medical history. A physical  exam. Your health care provider may check for: Rapid heartbeat. Rapid breathing. Tremors. High blood pressure. Blood tests. Urine tests. Your alcohol and drug habits. How is this treated? Treatment usually involves giving you a safe and stable dose of a benzodiazepine and then slowly lowering your dosage over time (tapered withdrawal). This may be done at a hospital or a treatment center. Treatment for this condition depends on: Your symptoms. The type of benzodiazepine you have been taking. How long you have been taking benzodiazepines. Long-term treatment for this condition may include medicine, counseling, and support groups. Follow these instructions at home: Take over-the-counter and prescription medicines only as told by your health care provider. Check with your health care provider before starting new medicines. How is this prevented? Do not take any benzodiazepines without a prescription. Do not take more than your prescribed dosage. Do not mix benzodiazepines with alcohol or other medicines. Do not stop taking benzodiazepines without speaking with your health care provider. Contact a health care provider if: You are not able to take your medicines as told by your health care provider. You have symptoms that get worse. You develop withdrawal symptoms during your tapered withdrawal. You develop a craving for drugs or alcohol. You start taking increased amounts of benzodiazepines again (relapse). Get help right away if: You have a seizure. You become very confused. You lose consciousness. You have difficulty breathing. These symptoms may be an emergency. Get help right away. Call 911. Do not wait to see if the symptoms will go away. Do not drive yourself to the hospital. Also, get help right away if: You have serious thoughts about hurting yourself or someone else. Take one of  these steps if you feel like you may hurt yourself or others, or have thoughts about taking your  own life: Go to your nearest emergency room. Call 911. Call the National Suicide Prevention Lifeline at 6186621298 or 988. This is open 24 hours a day. Text the Crisis Text Line at (214) 620-8876. Summary Withdrawal is a group of physical and mental symptoms that can happen when you suddenly stop taking a benzodiazepine. Treatment usually involves being given a safe and stable dose of a benzodiazepine and then slowly lowering the dosage over time. The type of treatment depends on your symptoms, how long you were taking the medicine, and which type of medicine you were taking. Long-term treatment may include medicine, counseling, and support groups. This information is not intended to replace advice given to you by your health care provider. Make sure you discuss any questions you have with your health care provider.     Layla Barter, NP 06/01/2023, 5:44 PM

## 2023-06-01 NOTE — Discharge Instructions (Addendum)
 Discharge recommendations:   Medications: Patient is to take medications as prescribed. Please consult with your primary care physician to discuss weaning off of the clonazepam as it can cause severe withdrawal symptoms.  Outpatient Follow up: Please review list of outpatient resources for psychiatry and counseling. Please follow up with your primary care provider for all medical related needs.   [facility] at [facility] [address] [place]  [place], [place] [address] [phone] Offers medication management and therapy.  [facility] Recovery Services - [facility]  Address: [address], [place], [place] [address] Phone: [phone]  [facility] Health Outpatient Behavioral Health at [facility] Address: [address], [place], [place] [address] Phone: [phone]  [facility] Health Outpatient Office - [place] Address: [address] [facility], [address], [place], [place] [address] Phone: [phone]  Therapy: We recommend that patient participate in individual therapy to address anxiety and mental health concerns.  Safety:   The following safety precautions should be taken:   No sharp objects. This includes scissors, razors, scrapers, and putty knives.   Chemicals should be removed and locked up.   Medications should be removed and locked up.   Weapons should be removed and locked up. This includes firearms, knives and instruments that can be used to cause injury.   The patient should abstain from use of illicit substances/drugs and abuse of any medications.  If symptoms worsen or do not continue to improve or if the patient becomes actively suicidal or homicidal then it is recommended that the patient return to the closest hospital emergency department, the [facility], or call 911 for further evaluation and treatment. National Suicide Prevention Lifeline 1-800-SUICIDE or  762 527 5018.  About 988 988 offers 24/7 access to trained crisis counselors who can help people experiencing mental health-related distress. People can call or text 988 or chat 988lifeline.org for themselves or if they are worried about a loved one who may need crisis support.   Benzodiazepine Withdrawal Benzodiazepine withdrawal is a group of physical and mental symptoms that happen when you suddenly stop taking benzodiazepines. Benzodiazepines are prescription medicines that decrease, or depress, the activity of the central nervous system and cause changes in certain brain chemicals (neurotransmitters). There are many types of benzodiazepines. Some benzodiazepines take effect quickly and stay in your body for a short amount of time (short-acting). Other benzodiazepines require more time to take effect and stay in your body for longer amounts of time (long-acting). The five most commonly prescribed benzodiazepines are: Alprazolam. Lorazepam. Clonazepam. Diazepam. Temazepam. What are the causes? When you take benzodiazepines, your brain needs more and more of the medicine over time to get the same effects from it. This increased need is called tolerance. As you develop a tolerance, your brain adapts to the effects of the benzodiazepine and relies on these effects. This is called dependency. Withdrawal happens when you suddenly stop taking your medicine. This does not give your brain enough time to adapt to not having the medicine. What increases the risk? You are more likely to develop this condition if: You have taken benzodiazepines for more than 1-2 weeks. You have developed a tolerance for benzodiazepines, or dependence on them. You take high dosages of benzodiazepines or doses that are higher than prescribed. You take benzodiazepines without a prescription. You use benzodiazepines with other substances that depress the central nervous system, such as alcohol. You have a history of drug  or alcohol  abuse. What are the signs or symptoms? Symptoms of this condition may include: Difficulty sleeping. Anxiety. Restlessness. Irritability. Muscle aches. Vomiting. Sweating. Other symptoms may include: Headaches. Involuntary shaking or trembling of a part of your body (tremor). Confusion and poor concentration. Feeling or seeing things that are not there (hallucinations). Seizures. Symptoms of withdrawal from short-acting benzodiazepines may develop 1-2 days after you stop taking your medicine, and they may last for 2-4 weeks or longer. Symptoms of withdrawal from long-acting benzodiazepines may develop 2-7 days after you stop taking your medicine, and they may last for 2-8 weeks or longer. How is this diagnosed? This condition may be diagnosed based on: Your symptoms. Your medical history. A physical exam. Your health care provider may check for: Rapid heartbeat. Rapid breathing. Tremors. High blood pressure. Blood tests. Urine tests. Your alcohol and drug habits. How is this treated? Treatment usually involves giving you a safe and stable dose of a benzodiazepine and then slowly lowering your dosage over time (tapered withdrawal). This may be done at a hospital or a treatment center. Treatment for this condition depends on: Your symptoms. The type of benzodiazepine you have been taking. How long you have been taking benzodiazepines. Long-term treatment for this condition may include medicine, counseling, and support groups. Follow these instructions at home: Take over-the-counter and prescription medicines only as told by your health care provider. Check with your health care provider before starting new medicines. How is this prevented? Do not take any benzodiazepines without a prescription. Do not take more than your prescribed dosage. Do not mix benzodiazepines with alcohol or other medicines. Do not stop taking benzodiazepines without speaking with your health  care provider. Contact a health care provider if: You are not able to take your medicines as told by your health care provider. You have symptoms that get worse. You develop withdrawal symptoms during your tapered withdrawal. You develop a craving for drugs or alcohol. You start taking increased amounts of benzodiazepines again (relapse). Get help right away if: You have a seizure. You become very confused. You lose consciousness. You have difficulty breathing. These symptoms may be an emergency. Get help right away. Call 911. Do not wait to see if the symptoms will go away. Do not drive yourself to the hospital. Also, get help right away if: You have serious thoughts about hurting yourself or someone else. Take one of these steps if you feel like you may hurt yourself or others, or have thoughts about taking your own life: Go to your nearest emergency room. Call 911. Call the National Suicide Prevention Lifeline at (765)191-4707 or 988. This is open 24 hours a day. Text the Crisis Text Line at 352-275-9973. Summary Withdrawal is a group of physical and mental symptoms that can happen when you suddenly stop taking a benzodiazepine. Treatment usually involves being given a safe and stable dose of a benzodiazepine and then slowly lowering the dosage over time. The type of treatment depends on your symptoms, how long you were taking the medicine, and which type of medicine you were taking. Long-term treatment may include medicine, counseling, and support groups. This information is not intended to replace advice given to you by your health care provider. Make sure you discuss any questions you have with your health care provider.

## 2023-06-05 ENCOUNTER — Encounter: Payer: Self-pay | Admitting: Family Medicine

## 2023-06-05 ENCOUNTER — Ambulatory Visit (INDEPENDENT_AMBULATORY_CARE_PROVIDER_SITE_OTHER): Admitting: Family Medicine

## 2023-06-05 VITALS — BP 121/73 | HR 75 | Temp 98.5°F | Ht 64.0 in | Wt 141.0 lb

## 2023-06-05 DIAGNOSIS — F1320 Sedative, hypnotic or anxiolytic dependence, uncomplicated: Secondary | ICD-10-CM

## 2023-06-05 MED ORDER — CLONAZEPAM 0.5 MG PO TABS: 0.5000 mg | ORAL_TABLET | Freq: Two times a day (BID) | ORAL | 0 refills | Status: DC

## 2023-06-05 NOTE — Progress Notes (Signed)
 Subjective:  Patient ID: Sandra Deleon, female    DOB: 1960-05-03, 63 y.o.   MRN: [id number]  Patient Care Team: Arrie Senate, FNP as PCP - General (Family Medicine)   Chief Complaint:  discuss anxiety meds/weaning   HPI: Sandra Deleon is a 63 y.o. female presenting on 06/05/2023 for discuss anxiety meds/weaning  Long term benzodiazepine use  Previously taking 1 mg 3 times daily. She tried to wean last year and she states that it was too fast and she had withdrawal from it then. She was then taking 1 mg in morning and 0.5 mg in evenings. Reports that ~3 weeks ago she started taking 0.5 mg in morning and 0.5 mg in the evenings. After 5 days, she started have symptoms of nausea, chills, body jerking, confusion, brain fog. She went to ED and received one time dose of Klonopin, symptoms improved and was discharged home. Since then continues to take 0.5 mg in am and pm. Reports limited side effects now. She would like to come off of Remeron as well. She believes that it is causing her to have weight gain.   Relevant past medical, surgical, family, and social history reviewed and updated as indicated.  Allergies and medications reviewed and updated. Data reviewed: Chart in Epic.   Past Medical History:  Diagnosis Date   Anxiety    Asthma    GERD (gastroesophageal reflux disease)    Hiatal hernia    Hoarse 10/13/2019    Past Surgical History:  Procedure Laterality Date   ESOPHAGOGASTRODUODENOSCOPY  04/09/2008   with Elease Hashimoto dilation   ESOPHAGOGASTRODUODENOSCOPY  04/25/2005   NOSE SURGERY     blockage removed from right side of nose   PARTIAL HYSTERECTOMY     still has ovaries   VAGINA RECONSTRUCTION SURGERY     past childbirth    Social History   Socioeconomic History   Marital status: Married    Spouse name: Marina Goodell   Number of children: 2   Years of education: Not on file   Highest education level: GED or equivalent  Occupational History   Occupation:  part time  Tobacco Use   Smoking status: Never   Smokeless tobacco: Never  Substance and Sexual Activity   Alcohol use: No   Drug use: No   Sexual activity: Yes  Other Topics Concern   Not on file  Social History Narrative   Patient is right-handed. She lives with her husband in a one level home. She does not drink caffeine. She exercises 3-5 x a week.   Social Drivers of Corporate investment banker Strain: Low Risk  (06/04/2023)   Overall Financial Resource Strain (CARDIA)    Difficulty of Paying Living Expenses: Not hard at all  Food Insecurity: No Food Insecurity (06/04/2023)   Hunger Vital Sign    Worried About Running Out of Food in the Last Year: Never true    Ran Out of Food in the Last Year: Never true  Transportation Needs: No Transportation Needs (06/04/2023)   PRAPARE - Administrator, Civil Service (Medical): No    Lack of Transportation (Non-Medical): No  Physical Activity: Insufficiently Active (06/04/2023)   Exercise Vital Sign    Days of Exercise per Week: 4 days    Minutes of Exercise per Session: 30 min  Stress: Stress Concern Present (06/04/2023)   [employer] of Occupational Health - Occupational Stress Questionnaire    Feeling of Stress : To some extent  Social Connections: Unknown (06/04/2023)   Social Connection and Isolation Panel [NHANES]    Frequency of Communication with Friends and Family: Once a week    Frequency of Social Gatherings with Friends and Family: Patient declined    Attends Religious Services: More than 4 times per year    Active Member of [employer] or Organizations: No    Attends Banker Meetings: Not on file    Marital Status: Married  Catering manager Violence: Not At Risk (05/11/2023)   Humiliation, Afraid, Rape, and Kick questionnaire    Fear of Current or Ex-Partner: No    Emotionally Abused: No    Physically Abused: No    Sexually Abused: No    Outpatient Encounter Medications as of 06/05/2023   Medication Sig   albuterol (PROVENTIL) (2.5 MG/3ML) 0.083% nebulizer solution Take 3 mLs (2.5 mg total) by nebulization every 6 (six) hours as needed for wheezing or shortness of breath.   clonazePAM (KLONOPIN) 0.5 MG tablet Take by mouth.   escitalopram (LEXAPRO) 20 MG tablet Take 20 mg by mouth daily.   mirtazapine (REMERON) 15 MG tablet Take 15 mg by mouth at bedtime.   montelukast (SINGULAIR) 10 MG tablet Take 1 tablet (10 mg total) by mouth at bedtime.   Multiple Vitamin (MULTIVITAMIN) tablet Take 1 tablet by mouth daily.   omega-3 acid ethyl esters (LOVAZA) 1 g capsule Take by mouth.   albuterol (PROVENTIL HFA;VENTOLIN HFA) 108 (90 Base) MCG/ACT inhaler Inhale 1-2 puffs into the lungs every 6 (six) hours as needed for wheezing.   [DISCONTINUED] fluticasone furoate-vilanterol (BREO ELLIPTA) 100-25 MCG/INH AEPB Inhale 1 puff into the lungs daily.   [DISCONTINUED] pantoprazole (PROTONIX) 40 MG tablet Take 1 tablet (40 mg total) by mouth daily. (Needs to be seen before next refill)   No facility-administered encounter medications on file as of 06/05/2023.    Allergies  Allergen Reactions   Bupropion Anxiety   Chocolate Flavoring Agent (Non-Screening)    Codeine     rash   Pseudoephedrine     Heart beating through body.    Review of Systems As per HPI  Objective:  BP 121/73   Pulse 75   Temp 98.5 F (36.9 C)   Ht 5\' 4"  (1.626 m)   Wt 141 lb (64 kg)   SpO2 98%   BMI 24.20 kg/m    Wt Readings from Last 3 Encounters:  06/05/23 141 lb (64 kg)  05/11/23 142 lb (64.4 kg)  01/28/18 137 lb (62.1 kg)    Physical Exam Constitutional:      General: She is awake. She is not in acute distress.    Appearance: Normal appearance. She is well-developed and well-groomed. She is not ill-appearing, toxic-appearing or diaphoretic.  Cardiovascular:     Rate and Rhythm: Normal rate and regular rhythm.     Pulses: Normal pulses.          Radial pulses are 2+ on the right side and 2+ on  the left side.       Posterior tibial pulses are 2+ on the right side and 2+ on the left side.     Heart sounds: Normal heart sounds. No murmur heard.    No gallop.  Pulmonary:     Effort: Pulmonary effort is normal. No respiratory distress.     Breath sounds: Normal breath sounds. No stridor. No wheezing, rhonchi or rales.  Musculoskeletal:     Cervical back: Full passive range of motion without pain and neck supple.  Right lower leg: No edema.     Left lower leg: No edema.  Skin:    General: Skin is warm.     Capillary Refill: Capillary refill takes less than 2 seconds.  Neurological:     General: No focal deficit present.     Mental Status: She is alert, oriented to person, place, and time and easily aroused. Mental status is at baseline.     GCS: GCS eye subscore is 4. GCS verbal subscore is 5. GCS motor subscore is 6.     Motor: No weakness.  Psychiatric:        Attention and Perception: Attention and perception normal.        Mood and Affect: Mood and affect normal.        Speech: Speech normal.        Behavior: Behavior normal. Behavior is cooperative.        Thought Content: Thought content normal. Thought content does not include homicidal or suicidal ideation. Thought content does not include homicidal or suicidal plan.        Cognition and Memory: Cognition and memory normal.        Judgment: Judgment normal.    Results for orders placed or performed in visit on 05/11/23  ToxASSURE Select 13 (MW), Urine   Collection Time: 05/11/23  2:05 PM  Result Value Ref Range   Summary FINAL        06/05/2023    3:14 PM 05/11/2023    1:11 PM 12/26/2017    2:01 PM 11/30/2017    8:06 AM 06/27/2017    2:20 PM  Depression screen PHQ 2/9  Decreased Interest 1 0 0 0 2  Down, Depressed, Hopeless 0 0 0 0 2  PHQ - 2 Score 1 0 0 0 4  Altered sleeping 0 1   0  Tired, decreased energy 1 1   0  Change in appetite 0 0   0  Feeling bad or failure about yourself  1 1   0  Trouble  concentrating 0 1   0  Moving slowly or fidgety/restless 0 1   1  Suicidal thoughts 0 0   0  PHQ-9 Score 3 5   5   Difficult doing work/chores Not difficult at all           06/05/2023    3:14 PM 05/11/2023    1:11 PM 06/05/2016   10:22 AM  GAD 7 : Generalized Anxiety Score  Nervous, Anxious, on Edge 1 0 2  Control/stop worrying 1 0 0  Worry too much - different things 1 0 1  Trouble relaxing 1 0 1  Restless 0 0 0  Easily annoyed or irritable 1 0 0  Afraid - awful might happen 1 0 1  Total GAD 7 Score 6 0 5  Anxiety Difficulty Not difficult at all Somewhat difficult Not difficult at all   Pertinent labs & imaging results that were available during my care of the patient were reviewed by me and considered in my medical decision making.  Assessment & Plan:  Sandra Deleon" was seen today for discuss anxiety meds/weaning.  Diagnoses and all orders for this visit:  Benzodiazepine dependence (HCC) Given history of withdrawal, will start slow wean of benzodiazepine. Encouraged patient to not make changes to her other medications at this time to mitigate side effects/withdrawal symptoms. Plan for wean  Take 0.5 mg twice daily for one month  Take 0.5 mg once daily for one month  Take 0.25 mg once daily for one month  Take 0.25 mg every other day for one month Then stop.  Refill provided at this time for patient to complete wean.  Will have close follow up to monitor  -     clonazePAM (KLONOPIN) 0.5 MG tablet; Take 1 tablet (0.5 mg total) by mouth 2 (two) times daily.  Continue all other maintenance medications.  Follow up plan: Return in about 4 weeks (around 07/03/2023) for Chronic Condition Follow up.   Continue healthy lifestyle choices, including diet (rich in fruits, vegetables, and lean proteins, and low in salt and simple carbohydrates) and exercise (at least 30 minutes of moderate physical activity daily).  Written and verbal instructions provided   The above assessment  and management plan was discussed with the patient. The patient verbalized understanding of and has agreed to the management plan. Patient is aware to call the clinic if they develop any new symptoms or if symptoms persist or worsen. Patient is aware when to return to the clinic for a follow-up visit. Patient educated on when it is appropriate to go to the emergency department.   Neale Burly, DNP-FNP [facility] [address] [place], [place] [address] [phone]

## 2023-06-05 NOTE — Patient Instructions (Addendum)
 Plan for taper  Take 0.5 mg twice daily for one month  Take 0.5 mg once daily for one month  Take 0.25 mg once daily for one month  Take 0.25 mg every other day for one month Then stop.

## 2023-06-13 ENCOUNTER — Encounter: Payer: Self-pay | Admitting: Internal Medicine

## 2023-07-03 ENCOUNTER — Ambulatory Visit: Admitting: Family Medicine

## 2023-07-05 ENCOUNTER — Ambulatory Visit: Admitting: Family Medicine

## 2023-07-05 ENCOUNTER — Encounter: Payer: Self-pay | Admitting: Family Medicine

## 2023-07-05 VITALS — BP 100/62 | HR 65 | Temp 98.3°F | Ht 64.0 in | Wt 137.0 lb

## 2023-07-05 DIAGNOSIS — F1320 Sedative, hypnotic or anxiolytic dependence, uncomplicated: Secondary | ICD-10-CM | POA: Diagnosis not present

## 2023-07-05 DIAGNOSIS — F5104 Psychophysiologic insomnia: Secondary | ICD-10-CM | POA: Diagnosis not present

## 2023-07-05 DIAGNOSIS — F330 Major depressive disorder, recurrent, mild: Secondary | ICD-10-CM

## 2023-07-05 DIAGNOSIS — F411 Generalized anxiety disorder: Secondary | ICD-10-CM | POA: Diagnosis not present

## 2023-07-05 MED ORDER — TRAZODONE HCL 50 MG PO TABS: 25.0000 mg | ORAL_TABLET | Freq: Every evening | ORAL | 0 refills | Status: DC | PRN

## 2023-07-05 NOTE — Progress Notes (Signed)
 Subjective:  Patient ID: Sandra Deleon, female    DOB: Jul 24, 1960, 63 y.o.   MRN: [id number]  Patient Care Team: Chrystine Crate, FNP as PCP - General (Family Medicine)   Chief Complaint:  Medical Management of Chronic Issues  HPI: Sandra Deleon is a 63 y.o. female presenting on 07/05/2023 for Medical Management of Chronic Issues  Patient is here to monitor discontinuation of benzodiazepines. Last month she tried to self-wean and experienced withdrawal symptoms of nausea, chills, body jerking, confusion, brain fog. We started a taper plan of Take 0.5 mg twice daily for one month. Take 0.5 mg once daily for one month.  Take 0.25 mg once daily for one month  Take 0.25 mg every other day for one month Then stop.  She is currently taking 0.5 mg daily and doing well with it. Not experiencing any symptoms of withdrawal.  She is also taking half of remeron, as she would like to stop that as well. She has been doing this for 2 weeks. States that she was taking it to help her sleep and she would like to stop it to switch to trazodone .    Relevant past medical, surgical, family, and social history reviewed and updated as indicated.  Allergies and medications reviewed and updated. Data reviewed: Chart in Epic.   Past Medical History:  Diagnosis Date   Anxiety    Asthma    GERD (gastroesophageal reflux disease)    Hiatal hernia    Hoarse 10/13/2019    Past Surgical History:  Procedure Laterality Date   ESOPHAGOGASTRODUODENOSCOPY  04/09/2008   with Londa Rival dilation   ESOPHAGOGASTRODUODENOSCOPY  04/25/2005   NOSE SURGERY     blockage removed from right side of nose   PARTIAL HYSTERECTOMY     still has ovaries   VAGINA RECONSTRUCTION SURGERY     past childbirth    Social History   Socioeconomic History   Marital status: Married    Spouse name: Elvin Hammer   Number of children: 2   Years of education: Not on file   Highest education level: GED or equivalent   Occupational History   Occupation: part time  Tobacco Use   Smoking status: Never   Smokeless tobacco: Never  Substance and Sexual Activity   Alcohol use: No   Drug use: No   Sexual activity: Yes  Other Topics Concern   Not on file  Social History Narrative   Patient is right-handed. She lives with her husband in a one level home. She does not drink caffeine. She exercises 3-5 x a week.   Social Drivers of Corporate investment banker Strain: Low Risk  (06/04/2023)   Overall Financial Resource Strain (CARDIA)    Difficulty of Paying Living Expenses: Not hard at all  Food Insecurity: No Food Insecurity (06/04/2023)   Hunger Vital Sign    Worried About Running Out of Food in the Last Year: Never true    Ran Out of Food in the Last Year: Never true  Transportation Needs: No Transportation Needs (06/04/2023)   PRAPARE - Administrator, Civil Service (Medical): No    Lack of Transportation (Non-Medical): No  Physical Activity: Insufficiently Active (06/04/2023)   Exercise Vital Sign    Days of Exercise per Week: 4 days    Minutes of Exercise per Session: 30 min  Stress: Stress Concern Present (06/04/2023)   [employer] of Occupational Health - Occupational Stress Questionnaire    Feeling of  Stress : To some extent  Social Connections: Unknown (06/04/2023)   Social Connection and Isolation Panel [NHANES]    Frequency of Communication with Friends and Family: Once a week    Frequency of Social Gatherings with Friends and Family: Patient declined    Attends Religious Services: More than 4 times per year    Active Member of [employer] or Organizations: No    Attends Engineer, structural: Not on file    Marital Status: Married  Catering manager Violence: Not At Risk (05/11/2023)   Humiliation, Afraid, Rape, and Kick questionnaire    Fear of Current or Ex-Partner: No    Emotionally Abused: No    Physically Abused: No    Sexually Abused: No    Outpatient Encounter  Medications as of 07/05/2023  Medication Sig   albuterol  (PROVENTIL ) (2.5 MG/3ML) 0.083% nebulizer solution Take 3 mLs (2.5 mg total) by nebulization every 6 (six) hours as needed for wheezing or shortness of breath.   clonazePAM  (KLONOPIN ) 0.5 MG tablet Take 1 tablet (0.5 mg total) by mouth 2 (two) times daily.   escitalopram (LEXAPRO) 20 MG tablet Take 20 mg by mouth daily.   mirtazapine (REMERON) 15 MG tablet Take 15 mg by mouth at bedtime.   montelukast  (SINGULAIR ) 10 MG tablet Take 1 tablet (10 mg total) by mouth at bedtime.   Multiple Vitamin (MULTIVITAMIN) tablet Take 1 tablet by mouth daily.   omega-3 acid ethyl esters (LOVAZA) 1 g capsule Take by mouth.   albuterol  (PROVENTIL  HFA;VENTOLIN  HFA) 108 (90 Base) MCG/ACT inhaler Inhale 1-2 puffs into the lungs every 6 (six) hours as needed for wheezing.   No facility-administered encounter medications on file as of 07/05/2023.    Allergies  Allergen Reactions   Bupropion  Anxiety   Chocolate Flavoring Agent (Non-Screening)    Codeine     rash   Other    Pseudoephedrine     Heart beating through body.    Review of Systems As per HPI  Objective:  BP 100/62   Pulse 65   Temp 98.3 F (36.8 C)   Ht 5\' 4"  (1.626 m)   Wt 137 lb (62.1 kg)   SpO2 96%   BMI 23.52 kg/m    Wt Readings from Last 3 Encounters:  07/05/23 137 lb (62.1 kg)  06/05/23 141 lb (64 kg)  05/11/23 142 lb (64.4 kg)    Physical Exam Constitutional:      General: She is awake. She is not in acute distress.    Appearance: Normal appearance. She is well-developed and well-groomed. She is not ill-appearing, toxic-appearing or diaphoretic.  Cardiovascular:     Rate and Rhythm: Normal rate and regular rhythm.     Pulses: Normal pulses.          Radial pulses are 2+ on the right side and 2+ on the left side.       Posterior tibial pulses are 2+ on the right side and 2+ on the left side.     Heart sounds: Normal heart sounds. No murmur heard.    No gallop.   Pulmonary:     Effort: Pulmonary effort is normal. No respiratory distress.     Breath sounds: Normal breath sounds. No stridor. No wheezing, rhonchi or rales.  Musculoskeletal:     Cervical back: Full passive range of motion without pain and neck supple.     Right lower leg: No edema.     Left lower leg: No edema.  Skin:  General: Skin is warm.     Capillary Refill: Capillary refill takes less than 2 seconds.  Neurological:     General: No focal deficit present.     Mental Status: She is alert, oriented to person, place, and time and easily aroused. Mental status is at baseline.     GCS: GCS eye subscore is 4. GCS verbal subscore is 5. GCS motor subscore is 6.     Motor: No weakness.  Psychiatric:        Attention and Perception: Attention and perception normal.        Mood and Affect: Mood and affect normal.        Speech: Speech normal.        Behavior: Behavior normal. Behavior is cooperative.        Thought Content: Thought content normal. Thought content does not include homicidal or suicidal ideation. Thought content does not include homicidal or suicidal plan.        Cognition and Memory: Cognition and memory normal.        Judgment: Judgment normal.     Results for orders placed or performed in visit on 05/11/23  ToxASSURE Select 13 (MW), Urine   Collection Time: 05/11/23  2:05 PM  Result Value Ref Range   Summary FINAL        07/05/2023   11:29 AM 06/05/2023    3:14 PM 05/11/2023    1:11 PM 12/26/2017    2:01 PM 11/30/2017    8:06 AM  Depression screen PHQ 2/9  Decreased Interest 0 1 0 0 0  Down, Depressed, Hopeless 0 0 0 0 0  PHQ - 2 Score 0 1 0 0 0  Altered sleeping  0 1    Tired, decreased energy  1 1    Change in appetite  0 0    Feeling bad or failure about yourself   1 1    Trouble concentrating  0 1    Moving slowly or fidgety/restless  0 1    Suicidal thoughts  0 0    PHQ-9 Score  3 5    Difficult doing work/chores  Not difficult at all           07/05/2023   11:29 AM 06/05/2023    3:14 PM 05/11/2023    1:11 PM 06/05/2016   10:22 AM  GAD 7 : Generalized Anxiety Score  Nervous, Anxious, on Edge 1 1 0 2  Control/stop worrying 1 1 0 0  Worry too much - different things 0 1 0 1  Trouble relaxing 1 1 0 1  Restless 0 0 0 0  Easily annoyed or irritable 1 1 0 0  Afraid - awful might happen 0 1 0 1  Total GAD 7 Score 4 6 0 5  Anxiety Difficulty Not difficult at all Not difficult at all Somewhat difficult Not difficult at all   Pertinent labs & imaging results that were available during my care of the patient were reviewed by me and considered in my medical decision making.  Assessment & Plan:  Kinzlie Harney" was seen today for medical management of chronic issues.  Diagnoses and all orders for this visit:  Generalized anxiety disorder Doing well with taper. Denies SI. Declines counseling.   Mild episode of recurrent major depressive disorder (HCC) As above.   Benzodiazepine dependence (HCC) As above.   Psychophysiological insomnia Will taper off of remeron. Will start trazodone  in one week after taper is complete.  -  traZODone  (DESYREL ) 50 MG tablet; Take 0.5-1 tablets (25-50 mg total) by mouth at bedtime as needed for sleep.  Continue all other maintenance medications.  Follow up plan: Return in about 4 weeks (around 08/02/2023) for Chronic Condition Follow up.   Continue healthy lifestyle choices, including diet (rich in fruits, vegetables, and lean proteins, and low in salt and simple carbohydrates) and exercise (at least 30 minutes of moderate physical activity daily).  Written and verbal instructions provided   The above assessment and management plan was discussed with the patient. The patient verbalized understanding of and has agreed to the management plan. Patient is aware to call the clinic if they develop any new symptoms or if symptoms persist or worsen. Patient is aware when to return to the clinic  for a follow-up visit. Patient educated on when it is appropriate to go to the emergency department.   Jacqualyn Mates, DNP-FNP [facility] [address] [place], [place] [address] [phone]

## 2023-07-05 NOTE — Patient Instructions (Signed)
 To discontinue Remeron take one half tablet every other day for one week then stop.

## 2023-07-18 ENCOUNTER — Ambulatory Visit: Admitting: Family Medicine

## 2023-07-24 ENCOUNTER — Encounter: Payer: Self-pay | Admitting: Family Medicine

## 2023-07-24 ENCOUNTER — Encounter: Payer: Self-pay | Admitting: Nurse Practitioner

## 2023-07-24 ENCOUNTER — Ambulatory Visit: Payer: Self-pay | Admitting: Family Medicine

## 2023-07-24 ENCOUNTER — Telehealth (INDEPENDENT_AMBULATORY_CARE_PROVIDER_SITE_OTHER): Admitting: Nurse Practitioner

## 2023-07-24 ENCOUNTER — Ambulatory Visit: Payer: Self-pay

## 2023-07-24 DIAGNOSIS — F5101 Primary insomnia: Secondary | ICD-10-CM | POA: Diagnosis not present

## 2023-07-24 DIAGNOSIS — F411 Generalized anxiety disorder: Secondary | ICD-10-CM | POA: Diagnosis not present

## 2023-07-24 MED ORDER — QUETIAPINE FUMARATE 25 MG PO TABS: 25.0000 mg | ORAL_TABLET | Freq: Every day | ORAL | 3 refills | Status: DC

## 2023-07-24 NOTE — Progress Notes (Signed)
 Normal. Repeat in one year from 02/2023

## 2023-07-24 NOTE — Progress Notes (Signed)
 Virtual Visit Consent   Sandra Deleon, you are scheduled for a virtual visit with Mary-Margaret Gaylyn Keas, FNP, a [facility] provider, today.     Just as with appointments in the office, your consent must be obtained to participate.  Your consent will be active for this visit and any virtual visit you may have with one of our providers in the next 365 days.     If you have a MyChart account, a copy of this consent can be sent to you electronically.  All virtual visits are billed to your insurance company just like a traditional visit in the office.    As this is a virtual visit, video technology does not allow for your provider to perform a traditional examination.  This may limit your provider's ability to fully assess your condition.  If your provider identifies any concerns that need to be evaluated in person or the need to arrange testing (such as labs, EKG, etc.), we will make arrangements to do so.     Although advances in technology are sophisticated, we cannot ensure that it will always work on either your end or our end.  If the connection with a video visit is poor, the visit may have to be switched to a telephone visit.  With either a video or telephone visit, we are not always able to ensure that we have a secure connection.     I need to obtain your verbal consent now.   Are you willing to proceed with your visit today? YES   Sandra Deleon has provided verbal consent on 07/24/2023 for a virtual visit (video or telephone).   Mary-Margaret Gaylyn Keas, FNP   Date: 07/24/2023 9:59 AM   Virtual Visit via Video Note   I, Mary-Margaret Gaylyn Keas, connected with Sandra Deleon ([id number], Jul 28, 1960) on 07/24/23 at 10:00 AM EDT by a video-enabled telemedicine application and verified that I am speaking with the correct person using two identifiers.  Location: Patient: Virtual Visit Location Patient: Home Provider: Virtual Visit Location Provider: Mobile   I discussed the  limitations of evaluation and management by telemedicine and the availability of in person appointments. The patient expressed understanding and agreed to proceed.    History of Present Illness: Sandra Deleon is a 63 y.o. who identifies as a female who was assigned female at birth, and is being seen today for anxietry.  HPI: Patient c/o anxiety. Says her "nerves are shot"!. Her body is very tired and she has not been sleeping. She has been trying to wean off of klonopin . She is down to 0.5 1/2 tablet daily. Says she is not doing well.is still on lexapro 20mg  daily. She was tried on trazadone to sleep but did not help.     Review of Systems  Constitutional:  Negative for diaphoresis and weight loss.  Eyes:  Negative for blurred vision, double vision and pain.  Respiratory:  Negative for shortness of breath.   Cardiovascular:  Negative for chest pain, palpitations, orthopnea and leg swelling.  Gastrointestinal:  Negative for abdominal pain.  Skin:  Negative for rash.  Neurological:  Negative for dizziness, sensory change, loss of consciousness, weakness and headaches.  Endo/Heme/Allergies:  Negative for polydipsia. Does not bruise/bleed easily.  Psychiatric/Behavioral:  Negative for memory loss. The patient does not have insomnia.   All other systems reviewed and are negative.   Problems:  Patient Active Problem List   Diagnosis Date Noted   Psychophysiological insomnia 07/05/2023   AVM (arteriovenous  malformation) brain 12/01/2022   Bradycardia 08/22/2022   Benzodiazepine dependence (HCC) 10/25/2018   Dyspareunia, female 04/19/2018   Mixed stress and urge urinary incontinence 04/19/2018   Fibromyalgia 03/25/2018   Peripheral positional vertigo 10/04/2011   GERD 04/06/2008   Mild persistent asthma without complication 04/03/2008   Dysthymic disorder 04/03/2008   Generalized anxiety disorder 04/22/2007   Recurrent major depressive disorder (HCC) 04/22/2007   HIATAL HERNIA  04/25/2005   Hiatal hernia 04/25/2005    Allergies:  Allergies  Allergen Reactions   Bupropion  Anxiety   Chocolate Flavoring Agent (Non-Screening)    Codeine     rash   Other    Pseudoephedrine     Heart beating through body.   Medications:  Current Outpatient Medications:    albuterol  (PROVENTIL  HFA;VENTOLIN  HFA) 108 (90 Base) MCG/ACT inhaler, Inhale 1-2 puffs into the lungs every 6 (six) hours as needed for wheezing., Disp: 1 Inhaler, Rfl: 6   albuterol  (PROVENTIL ) (2.5 MG/3ML) 0.083% nebulizer solution, Take 3 mLs (2.5 mg total) by nebulization every 6 (six) hours as needed for wheezing or shortness of breath., Disp: 150 mL, Rfl: 5   clonazePAM  (KLONOPIN ) 0.5 MG tablet, Take 1 tablet (0.5 mg total) by mouth 2 (two) times daily., Disp: 60 tablet, Rfl: 0   escitalopram (LEXAPRO) 20 MG tablet, Take 20 mg by mouth daily., Disp: , Rfl:    montelukast  (SINGULAIR ) 10 MG tablet, Take 1 tablet (10 mg total) by mouth at bedtime., Disp: 90 tablet, Rfl: 1   Multiple Vitamin (MULTIVITAMIN) tablet, Take 1 tablet by mouth daily., Disp: , Rfl:    omega-3 acid ethyl esters (LOVAZA) 1 g capsule, Take by mouth., Disp: , Rfl:    traZODone  (DESYREL ) 50 MG tablet, Take 0.5-1 tablets (25-50 mg total) by mouth at bedtime as needed for sleep., Disp: 30 tablet, Rfl: 0  Observations/Objective: Patient is well-developed, well-nourished in no acute distress.  Resting comfortably  at home.  Head is normocephalic, atraumatic.  No labored breathing.  Speech is clear and coherent with logical content.  Patient is alert and oriented at baseline.    Assessment and Plan:  Sandra Deleon in today with chief complaint of anxiety  1. Generalized anxiety disorder (Primary) Stress management Continue 1/2 0.5mg  klonopin  daily Continue lexapro 20mg  daily  2. Primary insomnia Bedtime routine Stop trazadone - QUEtiapine (SEROQUEL) 25 MG tablet; Take 1 tablet (25 mg total) by mouth at bedtime.  Dispense: 30  tablet; Refill: 3   Follow Up Instructions: I discussed the assessment and treatment plan with the patient. The patient was provided an opportunity to ask questions and all were answered. The patient agreed with the plan and demonstrated an understanding of the instructions.  A copy of instructions were sent to the patient via MyChart.  The patient was advised to call back or seek an in-person evaluation if the symptoms worsen or if the condition fails to improve as anticipated.  Time:  I spent 11 minutes with the patient via telehealth technology discussing the above problems/concerns.    Mary-Margaret Gaylyn Keas, FNP

## 2023-07-24 NOTE — Telephone Encounter (Signed)
 Chief Complaint: anxiety Symptoms: nausea, decreased appetite, depression, insomnia, feeling overwhelmed, tearful Frequency: worsening x 1 week Pertinent Negatives: Patient denies plans to harm herself. Disposition: [] ED /[] Urgent Care (no appt availability in office) / [x] Appointment(In office/virtual)/ []  [place] [facility]/ [] Home Care/ [] Refused Recommended Disposition /[] [place] [facility]/ []  Follow-up with PCP Additional Notes: Patient tearful during triage. She states she feels overwhelmed and has not been able to sleep. She has been tapering off her clonazepam  and was recently placed on trazodone . Patient states she has had thoughts to harm herself and denies any plans or actions taken to harm or kill herself. She states her husband is with her now at home. Patient agreeable to virtual visit, unsure how to use and gave instructions. Called [facility] and notified staff patient has not used it before and provided the cell # to use for virtual visit. Advised patient to call back for worsening symptoms or   Copied from CRM [id number]. Topic: Clinical - Medical Advice >> Jul 24, 2023  7:38 AM Concetta Dee E wrote: Reason for CRM: Pt would like a videocall. pt stated not sleeping, nerves are shot do not want to see a picture of food. Pt was prescribed traZODone , started doing unisom and felt that nothing was working. Reason for Disposition  MODERATE anxiety (e.g., persistent or frequent anxiety symptoms; interferes with sleep, school, or work)  Answer Assessment - Initial Assessment Questions 1. CONCERN: "Did anything happen that prompted you to call today?"      Having a hard time sleeping, recently had changes to medications and she feels the trazodone  does not work for her.  2. ANXIETY SYMPTOMS: "Can you describe how you (your loved one; patient) have been feeling?" (e.g., tense, restless, panicky, anxious, keyed up, overwhelmed, sense of impending doom).      "Hard to be still", anxiety  feels like it is through the roof, bothered by noised, miserable, shaking, crying  3. ONSET: "How long have you been feeling this way?" (e.g., hours, days, weeks)     She states it has worsened about a week.  4. SEVERITY: "How would you rate the level of anxiety?" (e.g., 0 - 10; or mild, moderate, severe).     Moderate.  5. FUNCTIONAL IMPAIRMENT: "How have these feelings affected your ability to do daily activities?" "Have you had more difficulty than usual doing your normal daily activities?" (e.g., getting better, same, worse; self-care, school, work, interactions)     Patient unable to sleep. She states yesterday she was able to do her exercises at home. She states today she doesn't feel like she would be able to do them.  6. HISTORY: "Have you felt this way before?" "Have you ever been diagnosed with an anxiety problem in the past?" (e.g., generalized anxiety disorder, panic attacks, PTSD). If Yes, ask: "How was this problem treated?" (e.g., medicines, counseling, etc.)     Anxiety disorder. Treated with medications.  7. RISK OF HARM - SUICIDAL IDEATION: "Do you ever have thoughts of hurting or killing yourself?" If Yes, ask:  "Do you have these feelings now?" "Do you have a plan on how you would do this?"     She states she has had thoughts but no plans to harm herself.  8. TREATMENT:  "What has been done so far to treat this anxiety?" (e.g., medicines, relaxation strategies). "What has helped?"     Patient has been placed on medications. She states clonazepam  has helped the best in the past, she is attempting to  wean off of them currently.  9. TREATMENT - THERAPIST: "Do you have a counselor or therapist? Name?"     No.  10. POTENTIAL TRIGGERS: "Do you drink caffeinated beverages (e.g., coffee, colas, teas), and how much daily?" "Do you drink alcohol or use any drugs?" "Have you started any new medicines recently?"       Denies any caffeine. Denies alcohol or using drugs.  11. PATIENT  SUPPORT: "Who is with you now?" "Who do you live with?" "Do you have family or friends who you can talk to?"        She lives with her husband and she states she can talk to him about it.  12. OTHER SYMPTOMS: "Do you have any other symptoms?" (e.g., feeling depressed, trouble concentrating, trouble sleeping, trouble breathing, palpitations or fast heartbeat, chest pain, sweating, nausea, or diarrhea)       Decreased appetite, feeling depressed, nausea.  13. PREGNANCY: "Is there any chance you are pregnant?" "When was your last menstrual period?"       N/A.  Protocols used: Anxiety and Panic Attack-A-AH

## 2023-07-24 NOTE — Telephone Encounter (Signed)
 Has appointment

## 2023-07-27 ENCOUNTER — Ambulatory Visit: Payer: Self-pay

## 2023-07-27 NOTE — Telephone Encounter (Signed)
 Chief Complaint: anxiety Symptoms: depression Frequency: chronic Pertinent Negatives: Patient denies suicide/homicidal plan or intent Disposition: [] ED /[] Urgent Care (no appt availability in office) / [x] Appointment(In office/virtual)/ []  [place] [facility]/ [] Home Care/ [] Refused Recommended Disposition /[] [place] [facility]/ []  Follow-up with PCP Additional Notes: Pt calling c/o anxiety and depression. Pt has been navigating sx with PCP and pt endorses that she has been on a Clonazepam  wean of 0.5mg  twice daily, but recently had to increase to 3x daily for sx relief. Pt calling to let PCP know that she had to increase Clonazepam  frequency. Pt did endorse wanting to "curl up on the couch and fall asleep and never wake up" but denied that she would actually do this. Of note, pt has strong faith and has a plan to continue her day with exercise and spending time outdoors to improve her mood. Triager attempted to advise [facility] for further assistance since PCP had no access. Pt declined and is aware of UC resource as she has previously needed to go there before. Triager also attempted to provide the [employer], but pt declined as well. Triager called PCP [facility] and notified Chelsea of pt triage call-- Joie Narrow will notify PCP RN.   Copied from CRM [id number]. Topic: Clinical - Red Word Triage >> Jul 27, 2023  9:06 AM Lizabeth Riggs wrote: Red Word that prompted transfer to Nurse Triage:  Sandra Deleon having high anxiety with very bad depression. She has thoughts of hurting herself but she will not do any harm to herself. Reason for Disposition  Substance use (drug use) or unhealthy alcohol use, known or suspected  Sometimes has thoughts of suicide  Answer Assessment - Initial Assessment Questions 1. CONCERN: "What happened that made you call today?"     "My anxiety is out the roof" "it makes me depressed because I cannot stand being this anxious" "If possible, I would like for you  to let Carolann Chum know.. I have been weaned down from clonazepam  and I have been taking more than I am supposed to help with sx "in order to cope" - endorses she will try to take 0.5mg  3x a day - endorses provided relief with this regimen "I dread going to sleep at night because I dread the morning" 2. DEPRESSION SYMPTOM SCREENING: "How are you feeling overall?" (e.g., decreased energy, increased sleeping or difficulty sleeping, difficulty concentrating, feelings of sadness, guilt, hopelessness, or worthlessness)     depressed 3. RISK OF HARM - SUICIDAL IDEATION:  "Do you ever have thoughts of hurting or killing yourself?"  (e.g., yes, no, no but preoccupation with thoughts about death)   - INTENT:  "Do you have thoughts of hurting or killing yourself right NOW?" (e.g., yes, no, N/A)   - PLAN: "Do you have a specific plan for how you would do this?" (e.g., gun, knife, overdose, no plan, N/A)     Endorses "had thoughts" of hurting self - "I cannot see myself doing anything" Denies plan - endorses curling up on the cough and falling asleep and not waking up 4. RISK OF HARM - HOMICIDAL IDEATION:  "Do you ever have thoughts of hurting or killing someone else?"  (e.g., yes, no, no but preoccupation with thoughts about death)   - INTENT:  "Do you have thoughts of hurting or killing someone right NOW?" (e.g., yes, no, N/A)   - PLAN: "Do you have a specific plan for how you would do this?" (e.g., gun, knife, no plan, N/A)  denies 5. FUNCTIONAL IMPAIRMENT: "How have things been going for you overall? Have you had more difficulty than usual doing your normal daily activities?"  (e.g., better, same, worse; self-care, school, work, interactions)     Worse to do ADLs 6. SUPPORT: "Who is with you now?" "Who do you live with?" "Do you have family or friends who you can talk to?"      Faith, "hubby" 7. THERAPIST: "Do you have a counselor or therapist? Name?"     Denies, just talking to Jesus 8. STRESSORS:  "Has there been any new stress or recent changes in your life?"     Medication changes, poor sleeping  10. OTHER: "Do you have any other physical symptoms right now?" (e.g., fever)       denies  Protocols used: Depression-A-AH, Suicide Concerns-A-AH

## 2023-07-31 ENCOUNTER — Encounter: Payer: Self-pay | Admitting: Nurse Practitioner

## 2023-07-31 ENCOUNTER — Ambulatory Visit: Admitting: Nurse Practitioner

## 2023-07-31 VITALS — BP 106/67 | HR 66 | Temp 98.1°F | Ht 64.0 in | Wt 132.0 lb

## 2023-07-31 DIAGNOSIS — F411 Generalized anxiety disorder: Secondary | ICD-10-CM | POA: Diagnosis not present

## 2023-07-31 MED ORDER — CLONAZEPAM 0.5 MG PO TABS: 0.5000 mg | ORAL_TABLET | Freq: Two times a day (BID) | ORAL | 5 refills | Status: DC

## 2023-07-31 MED ORDER — ESCITALOPRAM OXALATE 20 MG PO TABS
20.0000 mg | ORAL_TABLET | Freq: Every day | ORAL | 1 refills | Status: DC
Start: 2023-07-31 — End: 2024-01-29

## 2023-07-31 NOTE — Progress Notes (Signed)
 Subjective:    Patient ID: Sandra Deleon, female    DOB: [birth year], 63 y.o.   MRN: [id number]   Chief Complaint: Recheck anxiety   HPI  Patient her today for recheck anxiety. Video visit was done on 07/24/23 and her anxiety was discussed. She was not sleeping well so we started her on seroquel  at night to see if would help her sleep and help with her anxiety. She was told to continue her klonopin  and lexapro at current doses. She says that the seroquel  has really helped her sleep. She is still very anxious. She says mornings are very hard for her. SHe has been trying to wean off of klonopin  and she has had to go back up to 2 po daily and that makes her feel like a failure. She says she just can't wean off. She has been on klonopin  for >20 years.     07/31/2023    3:49 PM 07/05/2023   11:29 AM 06/05/2023    3:14 PM 05/11/2023    1:11 PM  GAD 7 : Generalized Anxiety Score  Nervous, Anxious, on Edge 3 1 1  0  Control/stop worrying 2 1 1  0  Worry too much - different things 3 0 1 0  Trouble relaxing 3 1 1  0  Restless 3 0 0 0  Easily annoyed or irritable 2 1 1  0  Afraid - awful might happen 3 0 1 0  Total GAD 7 Score 19 4 6  0  Anxiety Difficulty Somewhat difficult Not difficult at all Not difficult at all Somewhat difficult     Patient Active Problem List   Diagnosis Date Noted   Psychophysiological insomnia 07/05/2023   AVM (arteriovenous malformation) brain 12/01/2022   Bradycardia 08/22/2022   Benzodiazepine dependence (HCC) 10/25/2018   Dyspareunia, female 04/19/2018   Mixed stress and urge urinary incontinence 04/19/2018   Fibromyalgia 03/25/2018   Peripheral positional vertigo 10/04/2011   GERD 04/06/2008   Mild persistent asthma without complication 04/03/2008   Dysthymic disorder 04/03/2008   Generalized anxiety disorder 04/22/2007   Recurrent major depressive disorder (HCC) 04/22/2007   HIATAL HERNIA 04/25/2005   Hiatal hernia 04/25/2005       Review of Systems   Constitutional:  Negative for diaphoresis.  Eyes:  Negative for pain.  Respiratory:  Negative for shortness of breath.   Cardiovascular:  Negative for chest pain, palpitations and leg swelling.  Gastrointestinal:  Negative for abdominal pain.  Endocrine: Negative for polydipsia.  Skin:  Negative for rash.  Neurological:  Negative for dizziness, weakness and headaches.  Hematological:  Does not bruise/bleed easily.  All other systems reviewed and are negative.      Objective:   Physical Exam Vitals and nursing note reviewed.  Constitutional:      General: She is not in acute distress.    Appearance: Normal appearance. She is well-developed.  Neck:     Vascular: No carotid bruit or JVD.  Cardiovascular:     Rate and Rhythm: Normal rate and regular rhythm.     Heart sounds: Normal heart sounds.  Pulmonary:     Effort: Pulmonary effort is normal. No respiratory distress.     Breath sounds: Normal breath sounds. No wheezing or rales.  Chest:     Chest wall: No tenderness.  Abdominal:     General: Bowel sounds are normal. There is no distension or abdominal bruit.     Palpations: Abdomen is soft. There is no hepatomegaly, splenomegaly, mass or pulsatile mass.  Tenderness: There is no abdominal tenderness.  Musculoskeletal:        General: Normal range of motion.     Cervical back: Normal range of motion and neck supple.  Lymphadenopathy:     Cervical: No cervical adenopathy.  Skin:    General: Skin is warm and dry.  Neurological:     Mental Status: She is alert and oriented to person, place, and time.     Deep Tendon Reflexes: Reflexes are normal and symmetric.  Psychiatric:        Behavior: Behavior normal.        Thought Content: Thought content normal.        Judgment: Judgment normal.    BP 106/67   Pulse 66   Temp 98.1 F (36.7 C) (Temporal)   Ht 5\' 4"  (1.626 m)   Wt 132 lb (59.9 kg)   SpO2 98%   BMI 22.66 kg/m         Assessment & Plan:   [name] in today with chief complaint of Recheck anxiety   1. GAD (generalized anxiety disorder) (Primary) Stress management Ok to stay on klonopin  0.5mg  BID RTO in 1 month for recheck - clonazePAM  (KLONOPIN ) 0.5 MG tablet; Take 1 tablet (0.5 mg total) by mouth 2 (two) times daily.  Dispense: 60 tablet; Refill: 5    The above assessment and management plan was discussed with the patient. The patient verbalized understanding of and has agreed to the management plan. Patient is aware to call the clinic if symptoms persist or worsen. Patient is aware when to return to the clinic for a follow-up visit. Patient educated on when it is appropriate to go to the emergency department.   Mary-Margaret Gaylyn Keas, FNP

## 2023-07-31 NOTE — Patient Instructions (Signed)

## 2023-07-31 NOTE — Addendum Note (Signed)
 Addended by: Lakeya Mulka, MARY-MARGARET on: 07/31/2023 04:11 PM   Modules accepted: Orders

## 2023-08-02 ENCOUNTER — Ambulatory Visit: Admitting: Family Medicine

## 2023-08-09 ENCOUNTER — Telehealth: Payer: Self-pay

## 2023-08-09 NOTE — Telephone Encounter (Signed)
 Copied from CRM [id number]. Topic: Clinical - Medication Question >> Aug 09, 2023  2:15 PM Lotus Round B wrote: Reason for CRM: pt called in to see if Dr.martin can send in a different medication to help her sleep . She said when she wakes up  her energy is off and she feels weak in the chest .

## 2023-08-15 ENCOUNTER — Other Ambulatory Visit: Payer: Self-pay | Admitting: Nurse Practitioner

## 2023-08-15 DIAGNOSIS — F5101 Primary insomnia: Secondary | ICD-10-CM

## 2023-08-20 ENCOUNTER — Ambulatory Visit: Payer: Self-pay

## 2023-08-20 NOTE — Telephone Encounter (Signed)
 Needs to be seen to discuss increase

## 2023-08-20 NOTE — Telephone Encounter (Signed)
 Reason for Disposition  ??? Message left on identified voice mail    Protocols used: No Contact or Duplicate Contact Call-A-AH

## 2023-08-20 NOTE — Telephone Encounter (Addendum)
 FYI Only or Action Required?: Action required by provider  Patient was last seen in primary care on 07/31/2023 by Delfina Feller, FNP. Called Nurse Triage reporting request increase of medication. Symptoms began no contact/triage. Interventions attempted: Other: no contact/triage. Symptoms are: no contact/triage.  Triage Disposition: No Contact Calls  Patient/caregiver understands and will follow disposition?:   2nd attempt to return call, left message to call back for triage. Routing for no contact follow up.   1st attempt to return call, left voicemail to call back for triage. Note: for when she makes contact-address the two appointments that are scheduled for same reason.   Copied from CRM [id number]. Topic: Clinical - Medical Advice >> Aug 20, 2023  4:35 PM Tisa Forester wrote: Reason for CRM: patient ask to send message to Martin,Mary-Margaret  patient been taking clonazePAM  (KLONOPIN ) 0.5 MG tablet prescribe to take 2 per day  , patient been taking 3 pills instead , want to know if provider will increase to 3 tablets a day  its helping patient to get out of the anxiety pit, patient is starting to more things   patient call back number [phone]

## 2023-08-21 ENCOUNTER — Ambulatory Visit: Admitting: Nurse Practitioner

## 2023-08-21 NOTE — Telephone Encounter (Signed)
 Another encounter has same info. Patient was instructed to contact office for appt and that was done

## 2023-08-24 ENCOUNTER — Ambulatory Visit: Admitting: Nurse Practitioner

## 2023-08-24 ENCOUNTER — Encounter: Payer: Self-pay | Admitting: Nurse Practitioner

## 2023-08-24 VITALS — BP 101/62 | HR 55 | Temp 98.1°F | Ht 64.0 in | Wt 127.0 lb

## 2023-08-24 DIAGNOSIS — F5101 Primary insomnia: Secondary | ICD-10-CM

## 2023-08-24 DIAGNOSIS — F411 Generalized anxiety disorder: Secondary | ICD-10-CM

## 2023-08-24 MED ORDER — QUETIAPINE FUMARATE 25 MG PO TABS: 25.0000 mg | ORAL_TABLET | Freq: Every day | ORAL | 0 refills | Status: DC

## 2023-08-24 MED ORDER — CLONAZEPAM 1 MG PO TABS: 1.0000 mg | ORAL_TABLET | Freq: Two times a day (BID) | ORAL | 2 refills | Status: DC | PRN

## 2023-08-24 NOTE — Progress Notes (Signed)
 Subjective:    Patient ID: Sandra Deleon, female    DOB: 01-07-1961, 63 y.o.   MRN: [id number]   Chief Complaint: Discuss increase in meds   HPI  Patient is currently on seroquel  and klonopin . Her anxiety has increased. Sandra Deleon couldn't go to sleep because Sandra Deleon doesn't want to wake up the next day. Does not want to be by herself. Wonders where her husband is all the time. Just doesn't feel like doing anything. The last week Sandra Deleon has ade herself get up and exercise. Has been getting in her pool and that has seemed to turn her around.    08/24/2023    9:35 AM 07/31/2023    3:46 PM 07/05/2023   11:29 AM  Depression screen PHQ 2/9  Decreased Interest 2 3 0  Down, Depressed, Hopeless 2 2 0  PHQ - 2 Score 4 5 0  Altered sleeping 0 0   Tired, decreased energy 2 2   Change in appetite 1 0   Feeling bad or failure about yourself  0 1   Trouble concentrating 1 2   Moving slowly or fidgety/restless 1 1   Suicidal thoughts 1 1   PHQ-9 Score 10 12   Difficult doing work/chores Somewhat difficult Somewhat difficult       08/24/2023    9:36 AM 07/31/2023    3:49 PM 07/05/2023   11:29 AM 06/05/2023    3:14 PM  GAD 7 : Generalized Anxiety Score  Nervous, Anxious, on Edge 2 3 1 1   Control/stop worrying 2 2 1 1   Worry too much - different things 2 3 0 1  Trouble relaxing 2 3 1 1   Restless 2 3 0 0  Easily annoyed or irritable 1 2 1 1   Afraid - awful might happen 2 3 0 1  Total GAD 7 Score 13 19 4 6   Anxiety Difficulty Very difficult Somewhat difficult Not difficult at all Not difficult at all      Patient Active Problem List   Diagnosis Date Noted   Psychophysiological insomnia 07/05/2023   AVM (arteriovenous malformation) brain 12/01/2022   Bradycardia 08/22/2022   Benzodiazepine dependence (HCC) 10/25/2018   Dyspareunia, female 04/19/2018   Mixed stress and urge urinary incontinence 04/19/2018   Fibromyalgia 03/25/2018   Peripheral positional vertigo 10/04/2011   GERD 04/06/2008    Mild persistent asthma without complication 04/03/2008   Dysthymic disorder 04/03/2008   GAD (generalized anxiety disorder) 04/22/2007   Recurrent major depressive disorder (HCC) 04/22/2007   HIATAL HERNIA 04/25/2005   Hiatal hernia 04/25/2005       Review of Systems  Constitutional:  Negative for diaphoresis.  Eyes:  Negative for pain.  Respiratory:  Negative for shortness of breath.   Cardiovascular:  Negative for chest pain, palpitations and leg swelling.  Gastrointestinal:  Negative for abdominal pain.  Endocrine: Negative for polydipsia.  Skin:  Negative for rash.  Neurological:  Negative for dizziness, weakness and headaches.  Hematological:  Does not bruise/bleed easily.  All other systems reviewed and are negative.      Objective:   Physical Exam Constitutional:      Appearance: Normal appearance.   Cardiovascular:     Rate and Rhythm: Normal rate and regular rhythm.     Heart sounds: Normal heart sounds.  Pulmonary:     Effort: Pulmonary effort is normal.     Breath sounds: Normal breath sounds.   Skin:    General: Skin is warm.   Neurological:  General: No focal deficit present.     Mental Status: Sandra Deleon is alert and oriented to person, place, and time.   Psychiatric:        Mood and Affect: Mood normal.        Behavior: Behavior normal.    BP 101/62   Pulse (!) 55   Temp 98.1 F (36.7 C) (Temporal)   Ht 5' 4 (1.626 m)   Wt 127 lb (57.6 kg)   SpO2 95%   BMI 21.80 kg/m         Assessment & Plan:   Sandra Deleon in today with chief complaint of Discuss increase in meds   1. Primary insomnia Bedtime routine - QUEtiapine  (SEROQUEL ) 25 MG tablet; Take 1 tablet (25 mg total) by mouth at bedtime.  Dispense: 90 tablet; Refill: 0  2. Generalized anxiety disorder (Primary) Increased klonopin  to 1mg  BID- Sandra Deleon will do 1/2 tablets when Sandra Deleon can Continue exercise - clonazePAM  (KLONOPIN ) 1 MG tablet; Take 1 tablet (1 mg total) by mouth 2 (two)  times daily as needed for anxiety.  Dispense: 60 tablet; Refill: 2    The above assessment and management plan was discussed with the patient. The patient verbalized understanding of and has agreed to the management plan. Patient is aware to call the clinic if symptoms persist or worsen. Patient is aware when to return to the clinic for a follow-up visit. Patient educated on when it is appropriate to go to the emergency department.   Mary-Margaret Gaylyn Keas, FNP

## 2023-08-24 NOTE — Patient Instructions (Signed)

## 2023-08-27 ENCOUNTER — Ambulatory Visit: Admitting: Nurse Practitioner

## 2023-11-20 ENCOUNTER — Encounter: Payer: Self-pay | Admitting: Nurse Practitioner

## 2023-11-20 ENCOUNTER — Ambulatory Visit: Admitting: Nurse Practitioner

## 2023-11-20 VITALS — BP 105/59 | HR 58 | Temp 97.9°F | Ht 64.0 in | Wt 122.4 lb

## 2023-11-20 DIAGNOSIS — F411 Generalized anxiety disorder: Secondary | ICD-10-CM | POA: Diagnosis not present

## 2023-11-20 MED ORDER — CLONAZEPAM 0.5 MG PO TABS: ORAL_TABLET | ORAL | 2 refills | Status: AC

## 2023-11-20 NOTE — Patient Instructions (Signed)

## 2023-11-20 NOTE — Progress Notes (Signed)
 Subjective:    Patient ID: Sandra Deleon, female    DOB: Nov 28, 1960, 63 y.o.   MRN: [id number]   Chief Complaint: ANXIETY FOLLOW UP   HPI  Patient was seen on 08/24/23 with anxiety. She scored 13 on anxiety screening. We started her on klonopin  BID as well as seroquel  at night for sleep. She is taking 1 klonopin  around 11pm and then 1/2 tablet later in day. She says she feels some better bu anxiety screening has gone up. She is under a lot of stress with her moms help.    11/20/2023    9:56 AM 08/24/2023    9:36 AM 07/31/2023    3:49 PM 07/05/2023   11:29 AM  GAD 7 : Generalized Anxiety Score  Nervous, Anxious, on Edge 3 2 3 1   Control/stop worrying 2 2 2 1   Worry too much - different things 3 2 3  0  Trouble relaxing 3 2 3 1   Restless 2 2 3  0  Easily annoyed or irritable 1 1 2 1   Afraid - awful might happen 2 2 3  0  Total GAD 7 Score 16 13 19 4   Anxiety Difficulty Somewhat difficult Very difficult Somewhat difficult Not difficult at all     Patient Active Problem List   Diagnosis Date Noted   Psychophysiological insomnia 07/05/2023   AVM (arteriovenous malformation) brain 12/01/2022   Bradycardia 08/22/2022   Benzodiazepine dependence (HCC) 10/25/2018   Dyspareunia, female 04/19/2018   Mixed stress and urge urinary incontinence 04/19/2018   Fibromyalgia 03/25/2018   Peripheral positional vertigo 10/04/2011   GERD 04/06/2008   Mild persistent asthma without complication 04/03/2008   Dysthymic disorder 04/03/2008   GAD (generalized anxiety disorder) 04/22/2007   Recurrent major depressive disorder (HCC) 04/22/2007   HIATAL HERNIA 04/25/2005   Hiatal hernia 04/25/2005       Review of Systems  Constitutional:  Negative for diaphoresis.  Eyes:  Negative for pain.  Respiratory:  Negative for shortness of breath.   Cardiovascular:  Negative for chest pain, palpitations and leg swelling.  Gastrointestinal:  Negative for abdominal pain.  Endocrine: Negative for  polydipsia.  Skin:  Negative for rash.  Neurological:  Negative for dizziness, weakness and headaches.  Hematological:  Does not bruise/bleed easily.  All other systems reviewed and are negative.      Objective:   Physical Exam Constitutional:      Appearance: Normal appearance.  Cardiovascular:     Rate and Rhythm: Normal rate and regular rhythm.     Heart sounds: Normal heart sounds.  Pulmonary:     Breath sounds: Normal breath sounds.  Skin:    General: Skin is warm.  Neurological:     General: No focal deficit present.     Mental Status: She is alert.  Psychiatric:        Mood and Affect: Mood normal.        Behavior: Behavior normal.    BP (!) 105/59   Pulse (!) 58   Temp 97.9 F (36.6 C)   Ht 5' 4 (1.626 m)   Wt 122 lb 6.4 oz (55.5 kg)   SpO2 100%   BMI 21.01 kg/m      Assessment & Plan:   Montie JONELLE Bohr in today with chief complaint of ANXIETY FOLLOW UP   1. GAD (generalized anxiety disorder) (Primary) Changed klonopin  to 0.5mg  1-2 BID Continue stress management Continue lexapro  daily Patient did not want to add any new meds - clonazePAM  (KLONOPIN ) 0.5  MG tablet; 1-2 po bid  Dispense: 120 tablet; Refill: 2    The above assessment and management plan was discussed with the patient. The patient verbalized understanding of and has agreed to the management plan. Patient is aware to call the clinic if symptoms persist or worsen. Patient is aware when to return to the clinic for a follow-up visit. Patient educated on when it is appropriate to go to the emergency department.   Mary-Margaret Gladis, FNP

## 2024-01-29 ENCOUNTER — Other Ambulatory Visit: Payer: Self-pay | Admitting: *Deleted

## 2024-03-14 ENCOUNTER — Other Ambulatory Visit: Payer: Self-pay | Admitting: Nurse Practitioner

## 2024-03-14 DIAGNOSIS — F5101 Primary insomnia: Secondary | ICD-10-CM

## 2024-04-18 ENCOUNTER — Other Ambulatory Visit: Payer: Self-pay | Admitting: Nurse Practitioner

## 2024-04-18 ENCOUNTER — Telehealth: Payer: Self-pay

## 2024-04-18 DIAGNOSIS — F411 Generalized anxiety disorder: Secondary | ICD-10-CM

## 2024-04-18 NOTE — Telephone Encounter (Signed)
 This patient will need an appt it is controlled

## 2024-04-18 NOTE — Telephone Encounter (Signed)
 Copied from CRM [id number]. Topic: Clinical - Medication Refill >> Apr 18, 2024  1:33 PM Carrielelia G wrote: Medication: clonazePAM  (KLONOPIN ) 0.5 MG tablet   Has the patient contacted their pharmacy? Yes (Agent: If no, request that the patient contact the pharmacy for the refill. If patient does not wish to contact the pharmacy document the reason why and proceed with request.) (Agent: If yes, when and what did the pharmacy advise?)  This is the patient's preferred pharmacy:  [id number] - [place], [place] - [address] [place] [address] Phone: [phone] Fax: [phone]  Is this the correct pharmacy for this prescription? Yes If no, delete pharmacy and type the correct one.    Is the patient out of the medication? No  Has the patient been seen for an appointment in the last year OR does the patient have an upcoming appointment? Yes  Can we respond through MyChart? Yes  Agent: Please be advised that Rx refills may take up to 3 business days. We ask that you follow-up with your pharmacy.

## 2024-04-18 NOTE — Telephone Encounter (Signed)
 LMTCB to schedule appt for med refill with MMM

## 2024-04-21 ENCOUNTER — Ambulatory Visit: Payer: Self-pay | Admitting: Nurse Practitioner
